# Patient Record
Sex: Male | Born: 1965 | Race: White | Hispanic: No | Marital: Married | Smoking: Never smoker
Health system: Southern US, Community
[De-identification: ages and names within clinical notes are randomized; demographics above are authoritative.]

## PROBLEM LIST (undated history)

## (undated) DIAGNOSIS — R011 Cardiac murmur, unspecified: Secondary | ICD-10-CM

## (undated) DIAGNOSIS — K219 Gastro-esophageal reflux disease without esophagitis: Secondary | ICD-10-CM

## (undated) DIAGNOSIS — E119 Type 2 diabetes mellitus without complications: Secondary | ICD-10-CM

## (undated) DIAGNOSIS — G4730 Sleep apnea, unspecified: Secondary | ICD-10-CM

## (undated) DIAGNOSIS — I10 Essential (primary) hypertension: Secondary | ICD-10-CM

## (undated) HISTORY — PX: TONSILLECTOMY: SUR1361

## (undated) SURGERY — INSERTION, CARDIAC PACEMAKER
Anesthesia: Choice

---

## 2012-04-10 HISTORY — PX: BACK SURGERY: SHX140

## 2014-07-23 ENCOUNTER — Ambulatory Visit
Admit: 2014-07-23 | Disposition: A | Discharge status: Home / Self Care | Payer: Self-pay | Attending: Family Medicine | Admitting: Family Medicine

## 2014-10-14 ENCOUNTER — Other Ambulatory Visit: Payer: Self-pay | Admitting: Neurosurgery

## 2014-10-14 DIAGNOSIS — M5440 Lumbago with sciatica, unspecified side: Secondary | ICD-10-CM

## 2014-12-23 ENCOUNTER — Other Ambulatory Visit: Payer: Self-pay | Admitting: Neurosurgery

## 2014-12-23 DIAGNOSIS — M545 Low back pain: Secondary | ICD-10-CM

## 2014-12-25 ENCOUNTER — Ambulatory Visit
Admission: RE | Admit: 2014-12-25 | Discharge: 2014-12-25 | Disposition: A | Discharge status: Home / Self Care | Payer: BLUE CROSS/BLUE SHIELD | Source: Ambulatory Visit | Attending: Neurosurgery | Admitting: Neurosurgery

## 2014-12-25 DIAGNOSIS — M545 Low back pain: Secondary | ICD-10-CM

## 2015-01-07 ENCOUNTER — Other Ambulatory Visit (HOSPITAL_COMMUNITY): Payer: Self-pay | Admitting: Neurosurgery

## 2015-02-19 ENCOUNTER — Encounter (HOSPITAL_COMMUNITY)
Admission: RE | Admit: 2015-02-19 | Discharge: 2015-02-19 | Disposition: A | Discharge status: Home / Self Care | Payer: BLUE CROSS/BLUE SHIELD | Source: Ambulatory Visit | Attending: Neurosurgery | Admitting: Neurosurgery

## 2015-02-26 ENCOUNTER — Encounter (HOSPITAL_COMMUNITY)
Admission: RE | Admit: 2015-02-26 | Discharge: 2015-02-26 | Disposition: A | Discharge status: Home / Self Care | Payer: BLUE CROSS/BLUE SHIELD | Source: Ambulatory Visit | Attending: Neurosurgery | Admitting: Neurosurgery

## 2015-02-26 ENCOUNTER — Encounter (HOSPITAL_COMMUNITY): Payer: Self-pay

## 2015-02-26 DIAGNOSIS — K219 Gastro-esophageal reflux disease without esophagitis: Secondary | ICD-10-CM | POA: Insufficient documentation

## 2015-02-26 DIAGNOSIS — Z7982 Long term (current) use of aspirin: Secondary | ICD-10-CM | POA: Insufficient documentation

## 2015-02-26 DIAGNOSIS — Z79899 Other long term (current) drug therapy: Secondary | ICD-10-CM | POA: Insufficient documentation

## 2015-02-26 DIAGNOSIS — I10 Essential (primary) hypertension: Secondary | ICD-10-CM | POA: Insufficient documentation

## 2015-02-26 DIAGNOSIS — I4510 Unspecified right bundle-branch block: Secondary | ICD-10-CM | POA: Diagnosis not present

## 2015-02-26 DIAGNOSIS — Z01812 Encounter for preprocedural laboratory examination: Secondary | ICD-10-CM | POA: Diagnosis not present

## 2015-02-26 DIAGNOSIS — E119 Type 2 diabetes mellitus without complications: Secondary | ICD-10-CM | POA: Insufficient documentation

## 2015-02-26 DIAGNOSIS — Z7984 Long term (current) use of oral hypoglycemic drugs: Secondary | ICD-10-CM | POA: Insufficient documentation

## 2015-02-26 DIAGNOSIS — G4733 Obstructive sleep apnea (adult) (pediatric): Secondary | ICD-10-CM | POA: Insufficient documentation

## 2015-02-26 DIAGNOSIS — Z01818 Encounter for other preprocedural examination: Secondary | ICD-10-CM | POA: Diagnosis not present

## 2015-02-26 HISTORY — DX: Gastro-esophageal reflux disease without esophagitis: K21.9

## 2015-02-26 HISTORY — DX: Sleep apnea, unspecified: G47.30

## 2015-02-26 HISTORY — DX: Essential (primary) hypertension: I10

## 2015-02-26 HISTORY — DX: Cardiac murmur, unspecified: R01.1

## 2015-02-26 HISTORY — DX: Type 2 diabetes mellitus without complications: E11.9

## 2015-02-26 LAB — BASIC METABOLIC PANEL
Anion gap: 11 (ref 5–15)
BUN: 10 mg/dL (ref 6–20)
CALCIUM: 9.4 mg/dL (ref 8.9–10.3)
CO2: 24 mmol/L (ref 22–32)
CREATININE: 0.92 mg/dL (ref 0.61–1.24)
Chloride: 100 mmol/L — ABNORMAL LOW (ref 101–111)
GFR calc Af Amer: >60 mL/min (ref >60)
GLUCOSE: 433 mg/dL — AB (ref 65–99)
Potassium: 3.9 mmol/L (ref 3.5–5.1)
SODIUM: 135 mmol/L (ref 135–145)

## 2015-02-26 LAB — CBC WITH DIFFERENTIAL/PLATELET
BASOS ABS: 0 10*3/uL (ref 0.0–0.1)
BASOS PCT: 0 %
EOS ABS: 0.2 10*3/uL (ref 0.0–0.7)
EOS PCT: 2 %
HCT: 39.9 % (ref 39.0–52.0)
Hemoglobin: 13.8 g/dL (ref 13.0–17.0)
Lymphocytes Relative: 23 %
Lymphs Abs: 1.9 10*3/uL (ref 0.7–4.0)
MCH: 29.8 pg (ref 26.0–34.0)
MCHC: 34.6 g/dL (ref 30.0–36.0)
MCV: 86.2 fL (ref 78.0–100.0)
MONO ABS: 0.5 10*3/uL (ref 0.1–1.0)
Monocytes Relative: 6 %
Neutro Abs: 5.9 10*3/uL (ref 1.7–7.7)
Neutrophils Relative %: 69 %
Platelets: 235 10*3/uL (ref 150–400)
RBC: 4.63 MIL/uL (ref 4.22–5.81)
RDW: 13.1 % (ref 11.5–15.5)
WBC: 8.5 10*3/uL (ref 4.0–10.5)

## 2015-02-26 LAB — GLUCOSE, CAPILLARY: Glucose-Capillary: 365 mg/dL — ABNORMAL HIGH (ref 65–99)

## 2015-02-26 LAB — SURGICAL PCR SCREEN
MRSA, PCR: NEGATIVE
STAPHYLOCOCCUS AUREUS: NEGATIVE

## 2015-02-26 NOTE — Pre-Procedure Instructions (Addendum)
Alyson ReedyBradford N Amiri  02/26/2015     No Pharmacies Listed   Your procedure is scheduled on 03/11/15.  [facility] at 600 A.M.  Call this number if you have problems the morning of surgery:  [phone]   Remember:  Do not eat food or drink liquids after midnight.  Take these medicines the morning of surgery with A SIP OF WATER prevacid, tylenol   STOP all herbel meds, nsaids (aleve,naproxen,advil,ibuprofen) 5 days prior to surgery starting 03/06/15  including vitamins(C), aspirin,mobic  How to Manage Your Diabetes Before Surgery   Why is it important to control my blood sugar before and after surgery?   Improving blood sugar levels before and after surgery helps healing and can limit problems.  A way of improving blood sugar control is eating a healthy diet by:  - Eating less sugar and carbohydrates  - Increasing activity/exercise  - Talk with your doctor about reaching your blood sugar goals  High blood sugars (greater than 180 mg/dL) can raise your risk of infections and slow down your recovery so you will need to focus on controlling your diabetes during the weeks before surgery.  Make sure that the doctor who takes care of your diabetes knows about your planned surgery including the date and location.  How do I manage my blood sugars before surgery?   Check your blood sugar at least 4 times a day, 2 days before surgery to make sure that they are not too high or low.   Check your blood sugar the morning of your surgery when you wake up and every 2               hours until you get to the Short-Stay unit.  If your blood sugar is less than 70 mg/dL, you will need to treat for low blood sugar by:  Treat a low blood sugar (less than 70 mg/dL) with 1/2 cup of clear juice (cranberry or apple), 4 glucose tablets, OR glucose gel.  Recheck blood sugar in 15 minutes after treatment (to make sure it is greater than 70 mg/dL).  If blood sugar is not  greater than 70 mg/dL on re-check, call [phone] for further instructions.   Report your blood sugar to the Short-Stay nurse when you get to Short-Stay.  References:  [facility], 2007 "How to Manage your Diabetes Before and After Surgery".  What do I do about my diabetes medications?   Do not take oral diabetes medicines (pills) the morning of surgery.metformin, januvia          Do not take other diabetes injectables the day of surgery including Byetta, Victoza, Bydureon, and Trulicity.     Do not wear jewelry, make-up or nail polish.  Do not wear lotions, powders, or perfumes.  You may wear deodorant.  Do not shave 48 hours prior to surgery.  Men may shave face and neck.  Do not bring valuables to the hospital.  [facility] is not responsible for any belongings or valuables.  Contacts, dentures or bridgework may not be worn into surgery.  Leave your suitcase in the car.  After surgery it may be brought to your room.  For patients admitted to the hospital, discharge time will be determined by your treatment team.  Patients discharged the day of surgery will not be allowed to drive home.   Name and phone number of your driver:    Special instructions:   Special  Instructions: [place] - Preparing for Surgery  Before surgery, you can play an important role.  Because skin is not sterile, your skin needs to be as free of germs as possible.  You can reduce the number of germs on you skin by washing with CHG (chlorahexidine gluconate) soap before surgery.  CHG is an antiseptic cleaner which kills germs and bonds with the skin to continue killing germs even after washing.  Please DO NOT use if you have an allergy to CHG or antibacterial soaps.  If your skin becomes reddened/irritated stop using the CHG and inform your nurse when you arrive at Short Stay.  Do not shave (including legs and underarms) for at least 48 hours prior to the first CHG  shower.  You may shave your face.  Please follow these instructions carefully:   1.  Shower with CHG Soap the night before surgery and the morning of Surgery.  2.  If you choose to wash your hair, wash your hair first as usual with your normal shampoo.  3.  After you shampoo, rinse your hair and body thoroughly to remove the Shampoo.  4.  Use CHG as you would any other liquid soap.  You can apply chg directly  to the skin and wash gently with scrungie or a clean washcloth.  5.  Apply the CHG Soap to your body ONLY FROM THE NECK DOWN.  Do not use on open wounds or open sores.  Avoid contact with your eyes ears, mouth and genitals (private parts).  Wash genitals (private parts)       with your normal soap.  6.  Wash thoroughly, paying special attention to the area where your surgery will be performed.  7.  Thoroughly rinse your body with warm water from the neck down.  8.  DO NOT shower/wash with your normal soap after using and rinsing off the CHG Soap.  9.  Pat yourself dry with a clean towel.            10.  Wear clean pajamas.            11.  Place clean sheets on your bed the night of your first shower and do not sleep with pets.  Day of Surgery  Do not apply any lotions/deodorants the morning of surgery.  Please wear clean clothes to the hospital/surgery center.  Please read over the following fact sheets that you were given. Pain Booklet, Coughing and Deep Breathing, MRSA Information and Surgical Site Infection Prevention

## 2015-02-26 NOTE — Progress Notes (Signed)
Spoke with angela kabbe np re: elevated cbg 376 at preadmit. Patient just ate lunch . Instructed to tell patient to be vigilant about watching what eats and taking meds between now and surgery. If cbg over 200 dos surgery could be canceled.patients understands.

## 2015-02-27 LAB — HEMOGLOBIN A1C
Hgb A1c MFr Bld: 10.8 % — ABNORMAL HIGH (ref 4.8–5.6)
Mean Plasma Glucose: 263 mg/dL

## 2015-03-01 NOTE — Progress Notes (Signed)
Anesthesia Chart Review:  Pt is 49 year old male scheduled for removal of percutaneous hardware T12-L3 on 03/11/2015 with Dr. Jordan LikesPool.   PMH includes:  HTN, DM, OSA (no CPAP after lost 50 pounds), heart murmur, GERD. Never smoker. BMI 31.   Medications include: ASA, lipitor, exenatide, prevacid, metformin, sitagliptin, valsartan-hctz.   Preoperative labs reviewed.  Glucose 433, hgbA1c 10.8. Pt is aware case is likely to be cancelled if his blood glucose is more than 200 DOS. Notified Darl PikesSusan in Dr. Lindalou HosePool's office of pt's uncontrolled DM.   EKG 02/26/15: NSR. Possible Left atrial enlargement. Left axis deviation. Right bundle branch block  If blood glucose acceptable DOS, I anticipate pt can proceed as scheduled.   Rica Mastngela Jammy Stlouis, FNP-BC [facility]/Anesthesiology Phone: [phone] 03/01/2015 3:13 PM

## 2015-03-10 MED ORDER — CEFAZOLIN SODIUM-DEXTROSE 2-3 GM-% IV SOLR
2.0000 g | INTRAVENOUS | Status: AC
  Administered 2015-03-11: 2 g via INTRAVENOUS
  Filled 2015-03-10: qty 50

## 2015-03-10 MED ORDER — DEXAMETHASONE SODIUM PHOSPHATE 10 MG/ML IJ SOLN
10.0000 mg | INTRAMUSCULAR | Status: DC
  Filled 2015-03-10: qty 1

## 2015-03-11 ENCOUNTER — Ambulatory Visit (HOSPITAL_COMMUNITY): Payer: BLUE CROSS/BLUE SHIELD | Admitting: Emergency Medicine

## 2015-03-11 ENCOUNTER — Ambulatory Visit (HOSPITAL_COMMUNITY): Payer: BLUE CROSS/BLUE SHIELD | Admitting: Critical Care Medicine

## 2015-03-11 ENCOUNTER — Encounter (HOSPITAL_COMMUNITY): Payer: Self-pay | Admitting: Critical Care Medicine

## 2015-03-11 ENCOUNTER — Encounter (HOSPITAL_COMMUNITY)
Admission: RE | Disposition: A | Discharge status: Home / Self Care | Payer: Self-pay | Source: Ambulatory Visit | Attending: Neurosurgery

## 2015-03-11 ENCOUNTER — Observation Stay (HOSPITAL_COMMUNITY)
Admission: RE | Admit: 2015-03-11 | Discharge: 2015-03-11 | Disposition: A | Discharge status: Home / Self Care | Payer: BLUE CROSS/BLUE SHIELD | Source: Ambulatory Visit | Attending: Neurosurgery | Admitting: Neurosurgery

## 2015-03-11 DIAGNOSIS — Z6830 Body mass index (BMI) 30.0-30.9, adult: Secondary | ICD-10-CM | POA: Insufficient documentation

## 2015-03-11 DIAGNOSIS — E669 Obesity, unspecified: Secondary | ICD-10-CM | POA: Diagnosis not present

## 2015-03-11 DIAGNOSIS — E119 Type 2 diabetes mellitus without complications: Secondary | ICD-10-CM | POA: Diagnosis not present

## 2015-03-11 DIAGNOSIS — K219 Gastro-esophageal reflux disease without esophagitis: Secondary | ICD-10-CM | POA: Insufficient documentation

## 2015-03-11 DIAGNOSIS — Z981 Arthrodesis status: Secondary | ICD-10-CM | POA: Insufficient documentation

## 2015-03-11 DIAGNOSIS — T8484XA Pain due to internal orthopedic prosthetic devices, implants and grafts, initial encounter: Secondary | ICD-10-CM | POA: Diagnosis not present

## 2015-03-11 DIAGNOSIS — Z79899 Other long term (current) drug therapy: Secondary | ICD-10-CM | POA: Insufficient documentation

## 2015-03-11 DIAGNOSIS — I10 Essential (primary) hypertension: Secondary | ICD-10-CM | POA: Diagnosis not present

## 2015-03-11 DIAGNOSIS — Y831 Surgical operation with implant of artificial internal device as the cause of abnormal reaction of the patient, or of later complication, without mention of misadventure at the time of the procedure: Secondary | ICD-10-CM | POA: Diagnosis not present

## 2015-03-11 DIAGNOSIS — G4730 Sleep apnea, unspecified: Secondary | ICD-10-CM | POA: Insufficient documentation

## 2015-03-11 DIAGNOSIS — Z791 Long term (current) use of non-steroidal anti-inflammatories (NSAID): Secondary | ICD-10-CM | POA: Diagnosis not present

## 2015-03-11 DIAGNOSIS — Z7984 Long term (current) use of oral hypoglycemic drugs: Secondary | ICD-10-CM | POA: Insufficient documentation

## 2015-03-11 DIAGNOSIS — Z7982 Long term (current) use of aspirin: Secondary | ICD-10-CM | POA: Insufficient documentation

## 2015-03-11 HISTORY — PX: HARDWARE REMOVAL: SHX979

## 2015-03-11 LAB — GLUCOSE, CAPILLARY
GLUCOSE-CAPILLARY: 198 mg/dL — AB (ref 65–99)
GLUCOSE-CAPILLARY: 180 mg/dL — AB (ref 65–99)
GLUCOSE-CAPILLARY: 204 mg/dL — AB (ref 65–99)
Glucose-Capillary: 165 mg/dL — ABNORMAL HIGH (ref 65–99)

## 2015-03-11 SURGERY — REMOVAL, HARDWARE
Anesthesia: General | Site: Back

## 2015-03-11 MED ORDER — LIDOCAINE HCL (CARDIAC) 20 MG/ML IV SOLN
INTRAVENOUS | Status: DC | PRN
  Administered 2015-03-11: 80 mg via INTRAVENOUS

## 2015-03-11 MED ORDER — SUCCINYLCHOLINE CHLORIDE 20 MG/ML IJ SOLN
INTRAMUSCULAR | Status: AC
  Filled 2015-03-11: qty 1

## 2015-03-11 MED ORDER — HYDROCODONE-ACETAMINOPHEN 5-325 MG PO TABS: 1.0000 | ORAL_TABLET | ORAL | Status: DC | PRN

## 2015-03-11 MED ORDER — ATORVASTATIN CALCIUM 20 MG PO TABS
20.0000 mg | ORAL_TABLET | Freq: Every day | ORAL | Status: DC
  Administered 2015-03-11: 20 mg via ORAL
  Filled 2015-03-11: qty 1

## 2015-03-11 MED ORDER — KETOROLAC TROMETHAMINE 30 MG/ML IJ SOLN
30.0000 mg | Freq: Four times a day (QID) | INTRAMUSCULAR | Status: DC
  Administered 2015-03-11: 30 mg via INTRAVENOUS
  Filled 2015-03-11: qty 1

## 2015-03-11 MED ORDER — HEMOSTATIC AGENTS (NO CHARGE) OPTIME
TOPICAL | Status: DC | PRN
  Administered 2015-03-11: 1 via TOPICAL

## 2015-03-11 MED ORDER — ROCURONIUM BROMIDE 50 MG/5ML IV SOLN
INTRAVENOUS | Status: AC
Start: 2015-03-11 — End: 2015-03-11
  Filled 2015-03-11: qty 1

## 2015-03-11 MED ORDER — PROPOFOL 10 MG/ML IV BOLUS
INTRAVENOUS | Status: DC | PRN
  Administered 2015-03-11: 180 mg via INTRAVENOUS
  Administered 2015-03-11: 20 mg via INTRAVENOUS

## 2015-03-11 MED ORDER — ASPIRIN EC 81 MG PO TBEC
81.0000 mg | DELAYED_RELEASE_TABLET | Freq: Every day | ORAL | Status: DC
  Administered 2015-03-11: 81 mg via ORAL
  Filled 2015-03-11: qty 1

## 2015-03-11 MED ORDER — ADULT MULTIVITAMIN W/MINERALS CH
1.0000 | ORAL_TABLET | Freq: Every day | ORAL | Status: DC
  Administered 2015-03-11: 1 via ORAL
  Filled 2015-03-11: qty 1

## 2015-03-11 MED ORDER — FENTANYL CITRATE (PF) 250 MCG/5ML IJ SOLN
INTRAMUSCULAR | Status: AC
Start: 2015-03-11 — End: 2015-03-11
  Filled 2015-03-11: qty 5

## 2015-03-11 MED ORDER — GLYCOPYRROLATE 0.2 MG/ML IJ SOLN
INTRAMUSCULAR | Status: AC
  Filled 2015-03-11: qty 3

## 2015-03-11 MED ORDER — NEOSTIGMINE METHYLSULFATE 10 MG/10ML IV SOLN
INTRAVENOUS | Status: AC
  Filled 2015-03-11: qty 1

## 2015-03-11 MED ORDER — SODIUM CHLORIDE 0.9 % IR SOLN
Status: DC | PRN
  Administered 2015-03-11: 500 mL

## 2015-03-11 MED ORDER — BUPIVACAINE HCL (PF) 0.25 % IJ SOLN
INTRAMUSCULAR | Status: DC | PRN
  Administered 2015-03-11: 20 mL

## 2015-03-11 MED ORDER — CYCLOBENZAPRINE HCL 10 MG PO TABS: 10.0000 mg | ORAL_TABLET | Freq: Three times a day (TID) | ORAL | Status: DC | PRN

## 2015-03-11 MED ORDER — SODIUM CHLORIDE 0.9 % IJ SOLN: 3.0000 mL | Freq: Two times a day (BID) | INTRAMUSCULAR | Status: DC

## 2015-03-11 MED ORDER — SODIUM CHLORIDE 0.9 % IJ SOLN: 3.0000 mL | INTRAMUSCULAR | Status: DC | PRN

## 2015-03-11 MED ORDER — LIDOCAINE HCL (CARDIAC) 20 MG/ML IV SOLN
INTRAVENOUS | Status: AC
  Filled 2015-03-11: qty 5

## 2015-03-11 MED ORDER — PHENYLEPHRINE 40 MCG/ML (10ML) SYRINGE FOR IV PUSH (FOR BLOOD PRESSURE SUPPORT)
PREFILLED_SYRINGE | INTRAVENOUS | Status: AC
  Filled 2015-03-11: qty 10

## 2015-03-11 MED ORDER — HYDROMORPHONE HCL 1 MG/ML IJ SOLN
0.5000 mg | INTRAMUSCULAR | Status: DC | PRN
  Administered 2015-03-11: 1 mg via INTRAVENOUS
  Filled 2015-03-11: qty 1

## 2015-03-11 MED ORDER — NEOSTIGMINE METHYLSULFATE 10 MG/10ML IV SOLN
INTRAVENOUS | Status: DC | PRN
  Administered 2015-03-11: 4 mg via INTRAVENOUS

## 2015-03-11 MED ORDER — THROMBIN 5000 UNITS EX SOLR
CUTANEOUS | Status: DC | PRN
  Administered 2015-03-11 (×2): 5000 [IU] via TOPICAL

## 2015-03-11 MED ORDER — VALSARTAN-HYDROCHLOROTHIAZIDE 320-25 MG PO TABS: 1.0000 | ORAL_TABLET | Freq: Every day | ORAL | Status: DC

## 2015-03-11 MED ORDER — IRBESARTAN 300 MG PO TABS
300.0000 mg | ORAL_TABLET | Freq: Every day | ORAL | Status: DC
  Administered 2015-03-11: 300 mg via ORAL
  Filled 2015-03-11: qty 1

## 2015-03-11 MED ORDER — PROPOFOL 10 MG/ML IV BOLUS
INTRAVENOUS | Status: AC
Start: 2015-03-11 — End: 2015-03-11
  Filled 2015-03-11: qty 20

## 2015-03-11 MED ORDER — LACTATED RINGERS IV SOLN
INTRAVENOUS | Status: DC | PRN
  Administered 2015-03-11 (×2): via INTRAVENOUS

## 2015-03-11 MED ORDER — HYDROCHLOROTHIAZIDE 25 MG PO TABS
25.0000 mg | ORAL_TABLET | Freq: Every day | ORAL | Status: DC
  Administered 2015-03-11: 25 mg via ORAL
  Filled 2015-03-11: qty 1

## 2015-03-11 MED ORDER — PANTOPRAZOLE SODIUM 20 MG PO TBEC: 20.0000 mg | DELAYED_RELEASE_TABLET | Freq: Every day | ORAL | Status: DC

## 2015-03-11 MED ORDER — EPHEDRINE SULFATE 50 MG/ML IJ SOLN
INTRAMUSCULAR | Status: AC
  Filled 2015-03-11: qty 1

## 2015-03-11 MED ORDER — 0.9 % SODIUM CHLORIDE (POUR BTL) OPTIME
TOPICAL | Status: DC | PRN
  Administered 2015-03-11: 1000 mL

## 2015-03-11 MED ORDER — ONDANSETRON HCL 4 MG/2ML IJ SOLN
INTRAMUSCULAR | Status: DC | PRN
  Administered 2015-03-11: 4 mg via INTRAVENOUS

## 2015-03-11 MED ORDER — MIDAZOLAM HCL 2 MG/2ML IJ SOLN
INTRAMUSCULAR | Status: AC
Start: 2015-03-11 — End: 2015-03-11
  Filled 2015-03-11: qty 2

## 2015-03-11 MED ORDER — CEFAZOLIN SODIUM 1-5 GM-% IV SOLN
1.0000 g | Freq: Three times a day (TID) | INTRAVENOUS | Status: DC
  Administered 2015-03-11: 1 g via INTRAVENOUS
  Filled 2015-03-11: qty 50

## 2015-03-11 MED ORDER — METFORMIN HCL 500 MG PO TABS: 1000.0000 mg | ORAL_TABLET | Freq: Two times a day (BID) | ORAL | Status: DC

## 2015-03-11 MED ORDER — KETOROLAC TROMETHAMINE 30 MG/ML IJ SOLN
INTRAMUSCULAR | Status: AC
  Filled 2015-03-11: qty 1

## 2015-03-11 MED ORDER — MENTHOL 3 MG MT LOZG: 1.0000 | LOZENGE | OROMUCOSAL | Status: DC | PRN

## 2015-03-11 MED ORDER — PHENOL 1.4 % MT LIQD: 1.0000 | OROMUCOSAL | Status: DC | PRN

## 2015-03-11 MED ORDER — KETOROLAC TROMETHAMINE 30 MG/ML IJ SOLN
INTRAMUSCULAR | Status: DC | PRN
  Administered 2015-03-11: 30 mg via INTRAVENOUS

## 2015-03-11 MED ORDER — EXENATIDE ER 2 MG ~~LOC~~ PEN: 2.0000 mg | PEN_INJECTOR | SUBCUTANEOUS | Status: DC

## 2015-03-11 MED ORDER — ROCURONIUM BROMIDE 100 MG/10ML IV SOLN
INTRAVENOUS | Status: DC | PRN
  Administered 2015-03-11: 40 mg via INTRAVENOUS
  Administered 2015-03-11: 10 mg via INTRAVENOUS

## 2015-03-11 MED ORDER — ACETAMINOPHEN 325 MG PO TABS: 650.0000 mg | ORAL_TABLET | ORAL | Status: DC | PRN

## 2015-03-11 MED ORDER — ONDANSETRON HCL 4 MG/2ML IJ SOLN
4.0000 mg | INTRAMUSCULAR | Status: DC | PRN
  Administered 2015-03-11: 4 mg via INTRAVENOUS
  Filled 2015-03-11: qty 2

## 2015-03-11 MED ORDER — MELOXICAM 15 MG PO TABS
15.0000 mg | ORAL_TABLET | Freq: Every day | ORAL | Status: DC
  Administered 2015-03-11: 15 mg via ORAL
  Filled 2015-03-11: qty 1

## 2015-03-11 MED ORDER — FENTANYL CITRATE (PF) 250 MCG/5ML IJ SOLN
INTRAMUSCULAR | Status: DC | PRN
  Administered 2015-03-11 (×3): 50 ug via INTRAVENOUS
  Administered 2015-03-11: 25 ug via INTRAVENOUS
  Administered 2015-03-11: 50 ug via INTRAVENOUS
  Administered 2015-03-11: 25 ug via INTRAVENOUS

## 2015-03-11 MED ORDER — OXYCODONE-ACETAMINOPHEN 5-325 MG PO TABS
1.0000 | ORAL_TABLET | ORAL | Status: DC | PRN
  Administered 2015-03-11: 2 via ORAL
  Filled 2015-03-11: qty 2

## 2015-03-11 MED ORDER — GLYCOPYRROLATE 0.2 MG/ML IJ SOLN
INTRAMUSCULAR | Status: DC | PRN
  Administered 2015-03-11: 0.6 mg via INTRAVENOUS

## 2015-03-11 MED ORDER — LINAGLIPTIN 5 MG PO TABS: 5.0000 mg | ORAL_TABLET | Freq: Every day | ORAL | Status: DC

## 2015-03-11 MED ORDER — ACETAMINOPHEN 650 MG RE SUPP: 650.0000 mg | RECTAL | Status: DC | PRN

## 2015-03-11 MED ORDER — PHENYLEPHRINE HCL 10 MG/ML IJ SOLN
INTRAMUSCULAR | Status: DC | PRN
  Administered 2015-03-11 (×4): 80 ug via INTRAVENOUS
  Administered 2015-03-11: 40 ug via INTRAVENOUS

## 2015-03-11 MED ORDER — MIDAZOLAM HCL 5 MG/5ML IJ SOLN
INTRAMUSCULAR | Status: DC | PRN
  Administered 2015-03-11: 2 mg via INTRAVENOUS

## 2015-03-11 MED ORDER — VITAMIN C 500 MG PO TABS
500.0000 mg | ORAL_TABLET | Freq: Every day | ORAL | Status: DC
  Administered 2015-03-11: 500 mg via ORAL
  Filled 2015-03-11: qty 1

## 2015-03-11 MED ORDER — SODIUM CHLORIDE 0.9 % IV SOLN: 250.0000 mL | INTRAVENOUS | Status: DC

## 2015-03-11 SURGICAL SUPPLY — 44 items
BAG DECANTER FOR FLEXI CONT (MISCELLANEOUS) ×3 IMPLANT
BENZOIN TINCTURE PRP APPL 2/3 (GAUZE/BANDAGES/DRESSINGS) ×3 IMPLANT
BLADE CLIPPER SURG (BLADE) IMPLANT
BRUSH SCRUB EZ PLAIN DRY (MISCELLANEOUS) ×3 IMPLANT
BUR CUTTER 7.0 ROUND (BURR) ×3 IMPLANT
CANISTER SUCT 3000ML PPV (MISCELLANEOUS) ×3 IMPLANT
CLOSURE WOUND 1/2 X4 (GAUZE/BANDAGES/DRESSINGS) ×1
DECANTER SPIKE VIAL GLASS SM (MISCELLANEOUS) ×3 IMPLANT
DRAPE LAPAROTOMY 100X72X124 (DRAPES) ×3 IMPLANT
DRAPE MICROSCOPE LEICA (MISCELLANEOUS) ×3 IMPLANT
DRAPE POUCH INSTRU U-SHP 10X18 (DRAPES) ×3 IMPLANT
DRAPE PROXIMA HALF (DRAPES) IMPLANT
DRAPE SURG 17X23 STRL (DRAPES) ×6 IMPLANT
ELECT REM PT RETURN 9FT ADLT (ELECTROSURGICAL) ×3
ELECTRODE REM PT RTRN 9FT ADLT (ELECTROSURGICAL) ×1 IMPLANT
GAUZE SPONGE 4X4 12PLY STRL (GAUZE/BANDAGES/DRESSINGS) ×3 IMPLANT
GAUZE SPONGE 4X4 16PLY XRAY LF (GAUZE/BANDAGES/DRESSINGS) IMPLANT
GLOVE ECLIPSE 9.0 STRL (GLOVE) ×3 IMPLANT
GLOVE EXAM NITRILE LRG STRL (GLOVE) IMPLANT
GLOVE EXAM NITRILE MD LF STRL (GLOVE) IMPLANT
GLOVE EXAM NITRILE XL STR (GLOVE) IMPLANT
GLOVE EXAM NITRILE XS STR PU (GLOVE) IMPLANT
GOWN STRL REUS W/ TWL LRG LVL3 (GOWN DISPOSABLE) IMPLANT
GOWN STRL REUS W/ TWL XL LVL3 (GOWN DISPOSABLE) ×1 IMPLANT
GOWN STRL REUS W/TWL 2XL LVL3 (GOWN DISPOSABLE) IMPLANT
GOWN STRL REUS W/TWL LRG LVL3 (GOWN DISPOSABLE)
GOWN STRL REUS W/TWL XL LVL3 (GOWN DISPOSABLE) ×2
KIT BASIN OR (CUSTOM PROCEDURE TRAY) ×3 IMPLANT
KIT ROOM TURNOVER OR (KITS) ×3 IMPLANT
LIQUID BAND (GAUZE/BANDAGES/DRESSINGS) ×3 IMPLANT
NEEDLE HYPO 22GX1.5 SAFETY (NEEDLE) ×3 IMPLANT
NEEDLE SPNL 22GX3.5 QUINCKE BK (NEEDLE) ×3 IMPLANT
NS IRRIG 1000ML POUR BTL (IV SOLUTION) ×3 IMPLANT
PACK LAMINECTOMY NEURO (CUSTOM PROCEDURE TRAY) ×3 IMPLANT
PAD ARMBOARD 7.5X6 YLW CONV (MISCELLANEOUS) ×9 IMPLANT
RUBBERBAND STERILE (MISCELLANEOUS) ×6 IMPLANT
SPONGE SURGIFOAM ABS GEL 100 (HEMOSTASIS) ×3 IMPLANT
STRIP CLOSURE SKIN 1/2X4 (GAUZE/BANDAGES/DRESSINGS) ×2 IMPLANT
SUT VIC AB 2-0 CT1 18 (SUTURE) ×12 IMPLANT
SUT VIC AB 3-0 SH 8-18 (SUTURE) ×12 IMPLANT
TAPE STRIPS DRAPE STRL (GAUZE/BANDAGES/DRESSINGS) ×3 IMPLANT
TOWEL OR 17X24 6PK STRL BLUE (TOWEL DISPOSABLE) ×3 IMPLANT
TOWEL OR 17X26 10 PK STRL BLUE (TOWEL DISPOSABLE) ×3 IMPLANT
WATER STERILE IRR 1000ML POUR (IV SOLUTION) ×3 IMPLANT

## 2015-03-11 NOTE — Anesthesia Postprocedure Evaluation (Signed)
Anesthesia Post Note  Patient: Alyson ReedyBradford N Dignan  Procedure(s) Performed: Procedure(s) (LRB): Removal of percutaneous hardware  - T12 - L3 (N/A)  Patient location during evaluation: PACU Anesthesia Type: General Level of consciousness: awake and alert Pain management: pain level controlled Vital Signs Assessment: post-procedure vital signs reviewed and stable Respiratory status: spontaneous breathing, nonlabored ventilation, respiratory function stable and patient connected to nasal cannula oxygen Cardiovascular status: blood pressure returned to baseline and stable Postop Assessment: no signs of nausea or vomiting Anesthetic complications: no    Last Vitals:  Filed Vitals:   03/11/15 0656 03/11/15 0932  BP: 112/78 121/70  Pulse: 100 99  Temp: 37.2 C 37.1 C  Resp: 20     Last Pain:  Filed Vitals:   03/11/15 0940  PainSc: 0-No pain                 Elga Santy,JAMES TERRILL

## 2015-03-11 NOTE — Discharge Summary (Signed)
Physician Discharge Summary  Patient ID: Alyson ReedyBradford N Amara MRN: [id number] DOB/AGE: 232-Jan-1967 49 y.o.  Admit date: 03/11/2015 Discharge date: 03/11/2015  Admission Diagnoses:  Discharge Diagnoses:  Active Problems:   Painful orthopaedic hardware   Discharged Condition: good  Hospital Course: The patient was admitted to the hospital where he underwent an uncomplicated reexploration of his thoracic lumbar fusion with removal of painful hardware. Patient is doing well postop. Pain very well controlled. No new problems. Up ambulating without difficulty. Ready for discharge home.  Consults:   Significant Diagnostic Studies:   Treatments:   Discharge Exam: Blood pressure 119/76, pulse 95, temperature 98.3 F (36.8 C), temperature source Oral, resp. rate 20, height 6\' 1"  (1.854 m), weight 106.369 kg (234 lb 8 oz), SpO2 97 %. Awake and alert. Oriented and appropriate. Cranial nerve function is intact. Motor and sensory function extremities normal. Wounds clean and dry. Chest and abdomen benign.  Disposition: Final discharge disposition not confirmed     Medication List    TAKE these medications        acetaminophen 650 MG CR tablet  Commonly known as:  TYLENOL  Take 650 mg by mouth daily as needed for pain.     aspirin EC 81 MG tablet  Take 81 mg by mouth daily.     atorvastatin 20 MG tablet  Commonly known as:  LIPITOR  Take 20 mg by mouth daily.     BYDUREON 2 MG Pen  Generic drug:  Exenatide ER  Inject 2 mg into the skin once a week. Mondays     HYDROcodone-acetaminophen 5-325 MG tablet  Commonly known as:  NORCO/VICODIN  Take 1-2 tablets by mouth every 4 (four) hours as needed (mild pain).     ICY HOT EX  Apply 1 application topically daily as needed (back pain).     lansoprazole 30 MG capsule  Commonly known as:  PREVACID  Take 30 mg by mouth daily at 12 noon.     meloxicam 15 MG tablet  Commonly known as:  MOBIC  Take 15 mg by mouth daily.     metFORMIN 1000 MG tablet  Commonly known as:  GLUCOPHAGE  Take 1,000 mg by mouth 2 (two) times daily with a meal.     multivitamin with minerals Tabs tablet  Take 1 tablet by mouth daily.     sitaGLIPtin 100 MG tablet  Commonly known as:  JANUVIA  Take 100 mg by mouth daily.     valsartan-hydrochlorothiazide 320-25 MG tablet  Commonly known as:  DIOVAN-HCT  Take 1 tablet by mouth daily.     vitamin C 500 MG tablet  Commonly known as:  ASCORBIC ACID  Take 500 mg by mouth daily.           Follow-up Information    Follow up with Temple PaciniPOOL,Anthoni Geerts A, MD.   Specialty:  Neurosurgery   Contact information:   [address] [place] [address] [phone]       Signed: Temple PaciniOOL,Sinclair Arrazola A 03/11/2015, 5:28 PM
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
## 2015-03-11 NOTE — Anesthesia Postprocedure Evaluation (Signed)
Anesthesia Post Note  Patient: Douglas Greene  Procedure(s) Performed: Procedure(s) (LRB): Removal of percutaneous hardware  - T12 - L3 (N/A)  Patient location during evaluation: PACU Anesthesia Type: General Level of consciousness: awake and alert and oriented Pain management: pain level controlled Vital Signs Assessment: post-procedure vital signs reviewed and stable Respiratory status: spontaneous breathing and respiratory function stable Cardiovascular status: stable Anesthetic complications: no    Last Vitals:  Filed Vitals:   03/11/15 0656 03/11/15 0932  BP: 112/78 121/70  Pulse: 100 99  Temp: 37.2 C 37.1 C  Resp: 20     Last Pain:  Filed Vitals:   03/11/15 0940  PainSc: 0-No pain                 Charrie Mcconnon,JAMES TERRILL

## 2015-03-11 NOTE — Progress Notes (Signed)
Patient alert and oriented, mae's well, voiding adequate amount of urine, swallowing without difficulty, no c/o pain. Patient discharged home with family. Script and discharged instructions given to patient. Patient and family stated understanding of d/c instructions given and has an appointment with MD. 

## 2015-03-11 NOTE — Transfer of Care (Signed)
Immediate Anesthesia Transfer of Care Note  Patient: Douglas ReedyBradford N Heyward  Procedure(s) Performed: Procedure(s) with comments: Removal of percutaneous hardware  - T12 - L3 (N/A) - Removal of percutaneous hardware  - T12 - L3  Patient Location: PACU  Anesthesia Type:General  Level of Consciousness: awake, alert  and oriented  Airway & Oxygen Therapy: Patient Spontanous Breathing and Patient connected to nasal cannula oxygen  Post-op Assessment: Report given to RN, Post -op Vital signs reviewed and stable and Patient moving all extremities X 4  Post vital signs: Reviewed and stable  Last Vitals:  Filed Vitals:   03/11/15 0656 03/11/15 0932  BP: 112/78 121/70  Pulse: 100 99  Temp: 37.2 C 37.1 C  Resp: 20     Complications: No apparent anesthesia complications

## 2015-03-11 NOTE — Anesthesia Procedure Notes (Signed)
Procedure Name: Intubation Date/Time: 03/11/2015 7:57 AM Performed by: Glo HerringLEE, Shante Maysonet B Pre-anesthesia Checklist: Patient identified, Timeout performed, Emergency Drugs available, Suction available and Patient being monitored Patient Re-evaluated:Patient Re-evaluated prior to inductionOxygen Delivery Method: Circle system utilized Preoxygenation: Pre-oxygenation with 100% oxygen Intubation Type: IV induction Ventilation: Mask ventilation without difficulty Laryngoscope Size: Mac and 4 Grade View: Grade II Tube type: Oral Tube size: 7.5 mm Number of attempts: 1 Airway Equipment and Method: Stylet Placement Confirmation: CO2 detector,  ETT inserted through vocal cords under direct vision,  breath sounds checked- equal and bilateral and positive ETCO2 Secured at: 24 cm Tube secured with: Tape Dental Injury: Teeth and Oropharynx as per pre-operative assessment

## 2015-03-11 NOTE — Brief Op Note (Signed)
03/11/2015  9:18 AM  PATIENT:  Douglas Greene  49 y.o. male  PRE-OPERATIVE DIAGNOSIS:  Pseudoarthrosis  POST-OPERATIVE DIAGNOSIS:  Pseudoarthrosis  PROCEDURE:  Procedure(s) with comments: Removal of percutaneous hardware  - T12 - L3 (N/A) - Removal of percutaneous hardware  - T12 - L3  SURGEON:  Surgeon(s) and Role:    * Julio SicksHenry Shiree Altemus, MD - Primary    * Maeola HarmanJoseph Stern, MD - Assisting  PHYSICIAN ASSISTANT:   ASSISTANTS:    ANESTHESIA:   general  EBL:  Total I/O In: 1200 [I.V.:1200] Out: -   BLOOD ADMINISTERED:none  DRAINS: none   LOCAL MEDICATIONS USED:  MARCAINE     SPECIMEN:  No Specimen  DISPOSITION OF SPECIMEN:  N/A  COUNTS:  YES  TOURNIQUET:  * No tourniquets in log *  DICTATION: .Dragon Dictation  PLAN OF CARE: Admit for overnight observation  PATIENT DISPOSITION:  PACU - hemodynamically stable.   Delay start of Pharmacological VTE agent (>24hrs) due to surgical blood loss or risk of bleeding: yes

## 2015-03-11 NOTE — Discharge Instructions (Signed)

## 2015-03-11 NOTE — Anesthesia Preprocedure Evaluation (Addendum)
Anesthesia Evaluation  Patient identified by MRN, date of birth, ID band Patient awake    Reviewed: Allergy & Precautions, NPO status , Patient's Chart, lab work & pertinent test results  History of Anesthesia Complications Negative for: history of anesthetic complications  Airway Mallampati: II  TM Distance: >3 FB Neck ROM: Full    Dental  (+) Dental Advisory Given   Pulmonary sleep apnea ,    breath sounds clear to auscultation       Cardiovascular hypertension, Pt. on medications  Rhythm:Regular Rate:Normal  RBBB on EkG   Neuro/Psych    GI/Hepatic GERD  Medicated,  Endo/Other  diabetes, Type 2, Oral Hypoglycemic Agents  Renal/GU      Musculoskeletal   Abdominal (+) + obese,   Peds  Hematology   Anesthesia Other Findings   Reproductive/Obstetrics                           Anesthesia Physical Anesthesia Plan  ASA: III  Anesthesia Plan: General   Post-op Pain Management:    Induction: Intravenous  Airway Management Planned: Oral ETT  Additional Equipment:   Intra-op Plan:   Post-operative Plan: Extubation in OR  Informed Consent: I have reviewed the patients History and Physical, chart, labs and discussed the procedure including the risks, benefits and alternatives for the proposed anesthesia with the patient or authorized representative who has indicated his/her understanding and acceptance.   Dental advisory given  Plan Discussed with: Anesthesiologist and Surgeon  Anesthesia Plan Comments:        Anesthesia Quick Evaluation

## 2015-03-12 ENCOUNTER — Encounter (HOSPITAL_COMMUNITY): Payer: Self-pay | Admitting: Neurosurgery

## 2015-07-15 ENCOUNTER — Other Ambulatory Visit: Payer: Self-pay | Admitting: Neurosurgery

## 2015-07-15 DIAGNOSIS — S32009K Unspecified fracture of unspecified lumbar vertebra, subsequent encounter for fracture with nonunion: Secondary | ICD-10-CM

## 2015-07-20 ENCOUNTER — Ambulatory Visit
Admission: RE | Admit: 2015-07-20 | Discharge: 2015-07-20 | Disposition: A | Discharge status: Home / Self Care | Payer: BLUE CROSS/BLUE SHIELD | Source: Ambulatory Visit | Attending: Neurosurgery | Admitting: Neurosurgery

## 2015-07-20 DIAGNOSIS — S32009K Unspecified fracture of unspecified lumbar vertebra, subsequent encounter for fracture with nonunion: Secondary | ICD-10-CM

## 2015-07-20 DIAGNOSIS — M5134 Other intervertebral disc degeneration, thoracic region: Secondary | ICD-10-CM | POA: Diagnosis not present

## 2015-07-20 DIAGNOSIS — M5124 Other intervertebral disc displacement, thoracic region: Secondary | ICD-10-CM | POA: Diagnosis not present

## 2015-07-20 DIAGNOSIS — M5126 Other intervertebral disc displacement, lumbar region: Secondary | ICD-10-CM | POA: Diagnosis not present

## 2015-07-20 DIAGNOSIS — S32019A Unspecified fracture of first lumbar vertebra, initial encounter for closed fracture: Secondary | ICD-10-CM | POA: Insufficient documentation

## 2015-08-10 ENCOUNTER — Ambulatory Visit: Payer: BLUE CROSS/BLUE SHIELD

## 2015-08-23 ENCOUNTER — Other Ambulatory Visit: Payer: Self-pay | Admitting: Neurosurgery

## 2015-08-23 DIAGNOSIS — M5126 Other intervertebral disc displacement, lumbar region: Secondary | ICD-10-CM

## 2015-08-26 ENCOUNTER — Ambulatory Visit
Admission: RE | Admit: 2015-08-26 | Discharge: 2015-08-26 | Disposition: A | Discharge status: Home / Self Care | Payer: BLUE CROSS/BLUE SHIELD | Source: Ambulatory Visit | Attending: Neurosurgery | Admitting: Neurosurgery

## 2015-08-26 DIAGNOSIS — M5126 Other intervertebral disc displacement, lumbar region: Secondary | ICD-10-CM

## 2015-08-26 MED ORDER — IOPAMIDOL (ISOVUE-M 200) INJECTION 41%
1.0000 mL | Freq: Once | INTRAMUSCULAR | Status: AC
  Administered 2015-08-26: 1 mL via EPIDURAL

## 2015-08-26 MED ORDER — METHYLPREDNISOLONE ACETATE 40 MG/ML INJ SUSP (RADIOLOG
120.0000 mg | Freq: Once | INTRAMUSCULAR | Status: AC
  Administered 2015-08-26: 120 mg via EPIDURAL

## 2015-08-26 NOTE — Discharge Instructions (Signed)
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
## 2019-06-06 ENCOUNTER — Other Ambulatory Visit: Payer: Self-pay | Admitting: Cardiology

## 2019-06-06 ENCOUNTER — Other Ambulatory Visit: Payer: Self-pay

## 2019-06-06 ENCOUNTER — Other Ambulatory Visit
Admission: RE | Admit: 2019-06-06 | Discharge: 2019-06-06 | Disposition: A | Discharge status: Home / Self Care | Payer: BC Managed Care – PPO | Source: Ambulatory Visit | Attending: Cardiology | Admitting: Cardiology

## 2019-06-06 DIAGNOSIS — Z20822 Contact with and (suspected) exposure to covid-19: Secondary | ICD-10-CM | POA: Diagnosis not present

## 2019-06-06 DIAGNOSIS — Z01812 Encounter for preprocedural laboratory examination: Secondary | ICD-10-CM | POA: Insufficient documentation

## 2019-06-06 DIAGNOSIS — Z01818 Encounter for other preprocedural examination: Secondary | ICD-10-CM

## 2019-06-06 LAB — SARS CORONAVIRUS 2 (TAT 6-24 HRS): SARS Coronavirus 2: NEGATIVE

## 2019-06-06 MED ORDER — SODIUM CHLORIDE 0.9% FLUSH: 3.0000 mL | Freq: Two times a day (BID) | INTRAVENOUS | Status: AC

## 2019-06-10 ENCOUNTER — Other Ambulatory Visit: Payer: Self-pay

## 2019-06-10 ENCOUNTER — Ambulatory Visit: Payer: BC Managed Care – PPO

## 2019-06-10 ENCOUNTER — Observation Stay
Admission: RE | Admit: 2019-06-10 | Discharge: 2019-06-11 | Disposition: A | Discharge status: Home / Self Care | Payer: BC Managed Care – PPO | Attending: Cardiology | Admitting: Cardiology

## 2019-06-10 ENCOUNTER — Ambulatory Visit: Payer: BC Managed Care – PPO | Admitting: Anesthesiology

## 2019-06-10 ENCOUNTER — Observation Stay: Payer: BC Managed Care – PPO

## 2019-06-10 ENCOUNTER — Other Ambulatory Visit: Payer: Self-pay | Admitting: Cardiology

## 2019-06-10 ENCOUNTER — Encounter: Payer: Self-pay | Admitting: Cardiology

## 2019-06-10 ENCOUNTER — Encounter
Admission: RE | Disposition: A | Discharge status: Home / Self Care | Payer: Self-pay | Source: Home / Self Care | Attending: Cardiology

## 2019-06-10 DIAGNOSIS — E781 Pure hyperglyceridemia: Secondary | ICD-10-CM | POA: Insufficient documentation

## 2019-06-10 DIAGNOSIS — E119 Type 2 diabetes mellitus without complications: Secondary | ICD-10-CM | POA: Diagnosis not present

## 2019-06-10 DIAGNOSIS — Z79899 Other long term (current) drug therapy: Secondary | ICD-10-CM | POA: Diagnosis not present

## 2019-06-10 DIAGNOSIS — I10 Essential (primary) hypertension: Secondary | ICD-10-CM | POA: Insufficient documentation

## 2019-06-10 DIAGNOSIS — Z950 Presence of cardiac pacemaker: Secondary | ICD-10-CM

## 2019-06-10 DIAGNOSIS — Z01818 Encounter for other preprocedural examination: Secondary | ICD-10-CM

## 2019-06-10 DIAGNOSIS — Z794 Long term (current) use of insulin: Secondary | ICD-10-CM | POA: Diagnosis not present

## 2019-06-10 DIAGNOSIS — G4730 Sleep apnea, unspecified: Secondary | ICD-10-CM | POA: Diagnosis not present

## 2019-06-10 DIAGNOSIS — E785 Hyperlipidemia, unspecified: Secondary | ICD-10-CM | POA: Insufficient documentation

## 2019-06-10 DIAGNOSIS — Z7982 Long term (current) use of aspirin: Secondary | ICD-10-CM | POA: Diagnosis not present

## 2019-06-10 DIAGNOSIS — K219 Gastro-esophageal reflux disease without esophagitis: Secondary | ICD-10-CM | POA: Insufficient documentation

## 2019-06-10 DIAGNOSIS — I442 Atrioventricular block, complete: Principal | ICD-10-CM | POA: Diagnosis present

## 2019-06-10 DIAGNOSIS — I495 Sick sinus syndrome: Secondary | ICD-10-CM | POA: Insufficient documentation

## 2019-06-10 DIAGNOSIS — R9439 Abnormal result of other cardiovascular function study: Secondary | ICD-10-CM

## 2019-06-10 HISTORY — PX: PACEMAKER INSERTION: SHX728

## 2019-06-10 LAB — GLUCOSE, CAPILLARY
Glucose-Capillary: 226 mg/dL — ABNORMAL HIGH (ref 70–99)
Glucose-Capillary: 177 mg/dL — ABNORMAL HIGH (ref 70–99)
Glucose-Capillary: 155 mg/dL — ABNORMAL HIGH (ref 70–99)
Glucose-Capillary: 229 mg/dL — ABNORMAL HIGH (ref 70–99)
Glucose-Capillary: 302 mg/dL — ABNORMAL HIGH (ref 70–99)

## 2019-06-10 LAB — SURGICAL PCR SCREEN
MRSA, PCR: NEGATIVE
Staphylococcus aureus: POSITIVE — AB

## 2019-06-10 SURGERY — LEFT HEART CATH AND CORONARY ANGIOGRAPHY
Anesthesia: Moderate Sedation | Laterality: Left

## 2019-06-10 SURGERY — INSERTION, CARDIAC PACEMAKER
Anesthesia: Monitor Anesthesia Care | Site: Chest | Laterality: Left

## 2019-06-10 MED ORDER — FAMOTIDINE 20 MG PO TABS: 20.0000 mg | ORAL_TABLET | Freq: Once | ORAL | Status: AC

## 2019-06-10 MED ORDER — SODIUM CHLORIDE 0.9 % IV SOLN
80.0000 mg | INTRAVENOUS | Status: DC
  Filled 2019-06-10: qty 2

## 2019-06-10 MED ORDER — SODIUM CHLORIDE 0.9 % IV SOLN: INTRAVENOUS | Status: DC

## 2019-06-10 MED ORDER — FAMOTIDINE 20 MG PO TABS
ORAL_TABLET | ORAL | Status: AC
  Administered 2019-06-10: 20 mg via ORAL
  Filled 2019-06-10: qty 1

## 2019-06-10 MED ORDER — GENTAMICIN SULFATE 40 MG/ML IJ SOLN
INTRAMUSCULAR | Status: AC
  Filled 2019-06-10: qty 2

## 2019-06-10 MED ORDER — PROPOFOL 10 MG/ML IV BOLUS
INTRAVENOUS | Status: AC
  Filled 2019-06-10: qty 20

## 2019-06-10 MED ORDER — PROPOFOL 500 MG/50ML IV EMUL
INTRAVENOUS | Status: AC
  Filled 2019-06-10: qty 50

## 2019-06-10 MED ORDER — LIDOCAINE 1 % OPTIME INJ - NO CHARGE
INTRAMUSCULAR | Status: DC | PRN
  Administered 2019-06-10: 27 mL

## 2019-06-10 MED ORDER — FENTANYL CITRATE (PF) 100 MCG/2ML IJ SOLN
INTRAMUSCULAR | Status: AC
  Filled 2019-06-10: qty 2

## 2019-06-10 MED ORDER — CEFAZOLIN SODIUM-DEXTROSE 1-4 GM/50ML-% IV SOLN
1.0000 g | Freq: Four times a day (QID) | INTRAVENOUS | Status: AC
  Administered 2019-06-10 – 2019-06-11 (×3): 1 g via INTRAVENOUS
  Filled 2019-06-10 (×4): qty 50

## 2019-06-10 MED ORDER — CEFAZOLIN SODIUM-DEXTROSE 2-4 GM/100ML-% IV SOLN
INTRAVENOUS | Status: AC
  Filled 2019-06-10: qty 100

## 2019-06-10 MED ORDER — PROPOFOL 10 MG/ML IV BOLUS
INTRAVENOUS | Status: DC | PRN
  Administered 2019-06-10: 50 mg via INTRAVENOUS

## 2019-06-10 MED ORDER — MIDAZOLAM HCL 2 MG/2ML IJ SOLN
INTRAMUSCULAR | Status: AC
  Filled 2019-06-10: qty 2

## 2019-06-10 MED ORDER — METFORMIN HCL 500 MG PO TABS
1000.0000 mg | ORAL_TABLET | Freq: Two times a day (BID) | ORAL | Status: DC
  Administered 2019-06-11: 1000 mg via ORAL
  Filled 2019-06-10: qty 2

## 2019-06-10 MED ORDER — FENTANYL CITRATE (PF) 100 MCG/2ML IJ SOLN: 25.0000 ug | INTRAMUSCULAR | Status: DC | PRN

## 2019-06-10 MED ORDER — PHENYLEPHRINE HCL (PRESSORS) 10 MG/ML IV SOLN
INTRAVENOUS | Status: DC | PRN
  Administered 2019-06-10: 100 ug via INTRAVENOUS
  Administered 2019-06-10: 50 ug via INTRAVENOUS
  Administered 2019-06-10 (×4): 100 ug via INTRAVENOUS
  Administered 2019-06-10: 50 ug via INTRAVENOUS

## 2019-06-10 MED ORDER — ONDANSETRON HCL 4 MG/2ML IJ SOLN
4.0000 mg | Freq: Four times a day (QID) | INTRAMUSCULAR | Status: DC | PRN
  Filled 2019-06-10: qty 2

## 2019-06-10 MED ORDER — CEFAZOLIN SODIUM-DEXTROSE 2-4 GM/100ML-% IV SOLN
2.0000 g | INTRAVENOUS | Status: AC
  Administered 2019-06-10: 2 g via INTRAVENOUS

## 2019-06-10 MED ORDER — INSULIN GLARGINE 100 UNIT/ML ~~LOC~~ SOLN
60.0000 [IU] | Freq: Every day | SUBCUTANEOUS | Status: DC
  Administered 2019-06-10 – 2019-06-11 (×2): 60 [IU] via SUBCUTANEOUS
  Filled 2019-06-10 (×2): qty 0.6

## 2019-06-10 MED ORDER — INSULIN ASPART 100 UNIT/ML ~~LOC~~ SOLN
SUBCUTANEOUS | Status: AC
  Administered 2019-06-10: 2 [IU] via SUBCUTANEOUS
  Filled 2019-06-10: qty 1

## 2019-06-10 MED ORDER — MIDAZOLAM HCL 2 MG/2ML IJ SOLN
INTRAMUSCULAR | Status: DC | PRN
  Administered 2019-06-10: 2 mg via INTRAVENOUS

## 2019-06-10 MED ORDER — ATORVASTATIN CALCIUM 80 MG PO TABS
80.0000 mg | ORAL_TABLET | Freq: Every day | ORAL | Status: DC
  Administered 2019-06-10: 80 mg via ORAL
  Filled 2019-06-10: qty 1

## 2019-06-10 MED ORDER — PROPOFOL 500 MG/50ML IV EMUL
INTRAVENOUS | Status: DC | PRN
  Administered 2019-06-10: 100 ug/kg/min via INTRAVENOUS

## 2019-06-10 MED ORDER — HYDROCHLOROTHIAZIDE 12.5 MG PO CAPS
12.5000 mg | ORAL_CAPSULE | Freq: Every day | ORAL | Status: DC
  Administered 2019-06-10 – 2019-06-11 (×2): 12.5 mg via ORAL
  Filled 2019-06-10 (×2): qty 1

## 2019-06-10 MED ORDER — ONDANSETRON HCL 4 MG/2ML IJ SOLN: 4.0000 mg | Freq: Once | INTRAMUSCULAR | Status: DC | PRN

## 2019-06-10 MED ORDER — INSULIN ASPART 100 UNIT/ML ~~LOC~~ SOLN: 2.0000 [IU] | Freq: Once | SUBCUTANEOUS | Status: AC

## 2019-06-10 MED ORDER — ACETAMINOPHEN 325 MG PO TABS: 325.0000 mg | ORAL_TABLET | ORAL | Status: DC | PRN

## 2019-06-10 SURGICAL SUPPLY — 40 items
BAG DECANTER FOR FLEXI CONT (MISCELLANEOUS) ×5 IMPLANT
BRUSH SCRUB EZ  4% CHG (MISCELLANEOUS)
BRUSH SCRUB EZ 4% CHG (MISCELLANEOUS) ×1 IMPLANT
CABLE SURG 12 DISP A/V CHANNEL (MISCELLANEOUS) ×3 IMPLANT
CANISTER SUCT 1200ML W/VALVE (MISCELLANEOUS) ×3 IMPLANT
CHLORAPREP W/TINT 26 (MISCELLANEOUS) ×3 IMPLANT
COVER LIGHT HANDLE STERIS (MISCELLANEOUS) ×6 IMPLANT
COVER MAYO STAND REUSABLE (DRAPES) ×3 IMPLANT
COVER WAND RF STERILE (DRAPES) ×3 IMPLANT
DRAPE C-ARM XRAY 36X54 (DRAPES) ×3 IMPLANT
DRSG TEGADERM 4X4.75 (GAUZE/BANDAGES/DRESSINGS) ×3 IMPLANT
DRSG TELFA 4X3 1S NADH ST (GAUZE/BANDAGES/DRESSINGS) ×3 IMPLANT
ELECT REM PT RETURN 9FT ADLT (ELECTROSURGICAL) ×3
ELECTRODE REM PT RTRN 9FT ADLT (ELECTROSURGICAL) ×1 IMPLANT
GAUZE 4X4 16PLY RFD (DISPOSABLE) ×2 IMPLANT
GLIDEWIRE STIFF .35X180X3 HYDR (WIRE) IMPLANT
GLOVE BIO SURGEON STRL SZ7.5 (GLOVE) ×3 IMPLANT
GLOVE BIO SURGEON STRL SZ8 (GLOVE) ×3 IMPLANT
GOWN STRL REUS W/ TWL LRG LVL3 (GOWN DISPOSABLE) ×1 IMPLANT
GOWN STRL REUS W/ TWL XL LVL3 (GOWN DISPOSABLE) ×1 IMPLANT
GOWN STRL REUS W/TWL LRG LVL3 (GOWN DISPOSABLE) ×2
GOWN STRL REUS W/TWL XL LVL3 (GOWN DISPOSABLE) ×2
IMMOBILIZER SHDR MD LX WHT (SOFTGOODS) IMPLANT
IMMOBILIZER SHDR XL LX WHT (SOFTGOODS) ×2 IMPLANT
INTRO PACEMAKR LEAD 9FR 13CM (INTRODUCER) ×3
INTRO PACEMKR SHEATH II 7FR (MISCELLANEOUS) ×3
INTRODUCER PACEMKR LD 9FR 13CM (INTRODUCER) ×1 IMPLANT
INTRODUCER PACEMKR SHTH II 7FR (MISCELLANEOUS) ×1 IMPLANT
IPG PACE AZUR XT DR MRI W1DR01 (Pacemaker) IMPLANT
IV NS 500ML (IV SOLUTION) ×2
IV NS 500ML BAXH (IV SOLUTION) ×1 IMPLANT
KIT TURNOVER KIT A (KITS) ×3 IMPLANT
LABEL OR SOLS (LABEL) ×3 IMPLANT
LEAD CAPSURE NOVUS 5076-52CM (Lead) ×2 IMPLANT
LEAD CAPSURE NOVUS 5076-58CM (Lead) ×2 IMPLANT
MARKER SKIN DUAL TIP RULER LAB (MISCELLANEOUS) ×3 IMPLANT
PACE AZURE XT DR MRI W1DR01 (Pacemaker) ×3 IMPLANT
PACK PACE INSERTION (MISCELLANEOUS) ×3 IMPLANT
PAD ONESTEP ZOLL R SERIES ADT (MISCELLANEOUS) ×3 IMPLANT
SUT SILK 0 SH 30 (SUTURE) ×9 IMPLANT

## 2019-06-10 NOTE — Transfer of Care (Signed)
Immediate Anesthesia Transfer of Care Note  Patient: Douglas Greene  Procedure(s) Performed: INSERTION DUAL PACEMAKER (Left Chest)  Patient Location: PACU  Anesthesia Type:General  Level of Consciousness: drowsy and patient cooperative  Airway & Oxygen Therapy: Patient Spontanous Breathing  Post-op Assessment: Report given to RN and Post -op Vital signs reviewed and stable  Post vital signs: Reviewed and stable  Last Vitals:  Vitals Value Taken Time  BP 123/80 06/10/19 1327  Temp 36.4 C 06/10/19 1327  Pulse 86 06/10/19 1330  Resp 6 06/10/19 1330  SpO2 92 % 06/10/19 1330  Vitals shown include unvalidated device data.  Last Pain:  Vitals:   06/10/19 1327  TempSrc:   PainSc: 0-No pain         Complications: No apparent anesthesia complications

## 2019-06-10 NOTE — Anesthesia Preprocedure Evaluation (Signed)
Anesthesia Evaluation  Patient identified by MRN, date of birth, ID band Patient awake    Reviewed: Allergy & Precautions, NPO status , Patient's Chart, lab work & pertinent test results, reviewed documented beta blocker date and time   Airway Mallampati: II  TM Distance: >3 FB     Dental  (+) Teeth Intact   Pulmonary sleep apnea ,    Pulmonary exam normal        Cardiovascular hypertension, Pt. on medications Normal cardiovascular exam+ dysrhythmias      Neuro/Psych negative neurological ROS  negative psych ROS   GI/Hepatic Neg liver ROS, GERD  ,  Endo/Other  diabetes  Renal/GU negative Renal ROS  negative genitourinary   Musculoskeletal negative musculoskeletal ROS (+)   Abdominal Normal abdominal exam  (+)   Peds negative pediatric ROS (+)  Hematology negative hematology ROS (+)   Anesthesia Other Findings   Reproductive/Obstetrics                             Anesthesia Physical Anesthesia Plan  ASA: III  Anesthesia Plan: MAC   Post-op Pain Management:    Induction: Intravenous  PONV Risk Score and Plan:   Airway Management Planned: Nasal Cannula  Additional Equipment:   Intra-op Plan:   Post-operative Plan:   Informed Consent: I have reviewed the patients History and Physical, chart, labs and discussed the procedure including the risks, benefits and alternatives for the proposed anesthesia with the patient or authorized representative who has indicated his/her understanding and acceptance.     Dental advisory given  Plan Discussed with: CRNA and Surgeon  Anesthesia Plan Comments:         Anesthesia Quick Evaluation

## 2019-06-10 NOTE — Progress Notes (Signed)
Patient was scheduled for cardiac cath today, however, 72 Holter revealed 14 sec pause. Cardiac cath postponed. Patient requires urgent dual chamber pacemaker today. Patient COVID negative. H and P will be copied in chart.

## 2019-06-10 NOTE — Op Note (Signed)
[facility]   06/10/2019                     1:30 PM  PATIENT:  Douglas Greene    PRE-OPERATIVE DIAGNOSIS:  sick Sinus Syndrome  POST-OPERATIVE DIAGNOSIS:  Same  PROCEDURE:  INSERTION DUAL PACEMAKER  SURGEON:  Marcina Millard, MD    ANESTHESIA:     PREOPERATIVE INDICATIONS:  [name] is a  54 y.o. male with a diagnosis of sick Sinus Syndrome who failed conservative measures and elected for surgical management.    The risks benefits and alternatives were discussed with the patient preoperatively including but not limited to the risks of infection, bleeding, cardiopulmonary complications, the need for revision surgery, among others, and the patient was willing to proceed.   OPERATIVE PROCEDURE: The patient was brought to the operating room in a fasting state.  The left pectoral region was prepped and draped in the usual sterile manner.  Anesthesia was obtained 1% lidocaine locally.  A 6 cm incision was performed over the left pectoral region.  The pacemaker pocket was generated by electrocautery and blunt dissection.  Access was obtained to the left subclavian vein.  MRI compatible leads were positioned into the right ventricular apical septum (Medtronic 5076-58 cm [id number] )  and right atrial appendage ( Medtronic 5076 52 cm [id number] ) under fluoroscopic guidance.  After proper thresholds were obtained the leads were sutured in place with 0 silk.  The leads were connected to a MRI compatible dual-chamber rate responsive pacemaker generator ( Medtronic [id number] ) .  The pacemaker pocket was irrigated with gentamicin solution.  The pacemaker generator was positioned into the pocket and the pocket was closed with 2-0 and 4-0 Vicryl, respectively.  Steri-Strips and a pressure dressing were applied.  Postprocedural interrogation revealed appropriate dual-chamber atrial and ventricular sensing and pacing thresholds.  There were no periprocedural complications.

## 2019-06-11 ENCOUNTER — Other Ambulatory Visit: Payer: Self-pay | Admitting: Cardiology

## 2019-06-11 DIAGNOSIS — I442 Atrioventricular block, complete: Secondary | ICD-10-CM | POA: Diagnosis not present

## 2019-06-11 LAB — GLUCOSE, CAPILLARY: Glucose-Capillary: 169 mg/dL — ABNORMAL HIGH (ref 70–99)

## 2019-06-11 MED ORDER — CEPHALEXIN 250 MG PO CAPS: 250.0000 mg | ORAL_CAPSULE | Freq: Four times a day (QID) | ORAL | 0 refills | Status: DC

## 2019-06-11 MED ORDER — CALCIUM CARBONATE ANTACID 500 MG PO CHEW: 1.0000 | CHEWABLE_TABLET | ORAL | 3 refills | Status: AC | PRN

## 2019-06-11 MED ORDER — CEPHALEXIN 250 MG PO CAPS: 250.0000 mg | ORAL_CAPSULE | Freq: Four times a day (QID) | ORAL | 0 refills | Status: AC

## 2019-06-11 MED ORDER — CALCIUM CARBONATE ANTACID 500 MG PO CHEW: 1.0000 | CHEWABLE_TABLET | ORAL | Status: DC | PRN

## 2019-06-11 NOTE — Anesthesia Postprocedure Evaluation (Signed)
Anesthesia Post Note  Patient: Douglas Greene  Procedure(s) Performed: INSERTION DUAL PACEMAKER (Left Chest)  Patient location during evaluation: PACU Anesthesia Type: MAC Level of consciousness: awake and alert and oriented Pain management: pain level controlled Vital Signs Assessment: post-procedure vital signs reviewed and stable Respiratory status: spontaneous breathing Cardiovascular status: blood pressure returned to baseline Anesthetic complications: no     Last Vitals:  Vitals:   06/11/19 0400 06/11/19 0805  BP: 135/81 (!) 146/87  Pulse: 73 69  Resp: 16 18  Temp: 36.7 C 36.7 C  SpO2: 95% 98%    Last Pain:  Vitals:   06/11/19 0805  TempSrc: Oral  PainSc:                  Fraidy Mccarrick

## 2019-06-11 NOTE — Discharge Summary (Signed)
Physician Discharge Summary      Patient ID: Douglas Greene MRN: [id number] DOB/AGE: 1966/03/12 54 y.o.  Admit date: 06/10/2019 Discharge date: 06/11/2019  Primary Discharge Diagnosis High grade heart block   Hospital Course: Mr. Granderson is a 54 year old male with a past medical history significant for type 2 diabetes, hypertension, and hypertriglyceridemia who presented to [facility] on 06/10/19 for dual chamber pacemaker implantation due to high degree heart block detected on long term Holter monitoring.  He was originally scheduled for an elective outpatient catheterization due to an abnormal cardiac CT, however, Holter monitor revealed a 14 second pause.  He underwent the procedure with Dr. Darrold Junker with no evidence of postprocedure complications.    Today, Mr Bilotti reports that he is doing well with minor soreness over the incision site, but no evidence of bleeding or drainage.  He denies chest pain, palpitations, shortness of breath, or lower extremity swelling.  He also denies recurrent dizziness or near syncopal episodes.     Discharge Exam: Blood pressure (!) 146/87, pulse 69, temperature 98 F (36.7 C), temperature source Oral, resp. rate 18, height 5\' 11"  (1.803 m), weight 109.3 kg, SpO2 98 %.   General appearance: alert, cooperative and appears stated age Head: Normocephalic, without obvious abnormality, atraumatic Resp: clear to auscultation bilaterally Chest wall: no tenderness, left sided chest wall tenderness Cardio: regular rate and rhythm, S1, S2 normal, no murmur, click, rub or gallop Extremities: extremities normal, atraumatic, no cyanosis or edema Pulses: 2+ and symmetric Skin: Skin color, texture, turgor normal. No rashes or lesions or incision site clean, intact, with no evidence of erythema, edema, or ecchymosis Labs:   Lab Results  Component Value Date   WBC 8.5 02/26/2015   HGB 13.8 02/26/2015   HCT 39.9 02/26/2015   MCV 86.2 02/26/2015   PLT 235  02/26/2015   No results for input(s): NA, K, CL, CO2, BUN, CREATININE, CALCIUM, PROT, BILITOT, ALKPHOS, ALT, AST, GLUCOSE in the last 168 hours.  Invalid input(s): LABALBU    Radiology: Chest xray: Pacemaker leads present in right atrium and right ventricle; no evidence of pneumothorax or acute disease  EKG: Ventricular paced rhythm   FOLLOW UP PLANS AND APPOINTMENTS  Allergies as of 06/11/2019   No Known Allergies     Medication List    TAKE these medications   acetaminophen 650 MG CR tablet Commonly known as: TYLENOL Take 650 mg by mouth daily as needed for pain.   aluminum hydroxide-magnesium carbonate 95-358 MG/15ML Susp Commonly known as: GAVISCON Take 15 mLs by mouth daily as needed for indigestion.   aspirin EC 81 MG tablet Take 81 mg by mouth daily.   atorvastatin 80 MG tablet Commonly known as: LIPITOR Take 80 mg by mouth daily.   calcium carbonate 500 MG chewable tablet Commonly known as: TUMS - dosed in mg elemental calcium Chew 4 tablets by mouth daily as needed for indigestion or heartburn.   cephALEXin 250 MG capsule Commonly known as: KEFLEX Take 1 capsule (250 mg total) by mouth 4 (four) times daily for 7 days.   insulin glargine 100 UNIT/ML injection Commonly known as: LANTUS Inject 60 Units into the skin daily.   Jardiance 25 MG Tabs tablet Generic drug: empagliflozin Take 25 mg by mouth daily.   lansoprazole 30 MG capsule Commonly known as: PREVACID Take 30 mg by mouth daily.   liraglutide 18 MG/3ML Sopn Commonly known as: VICTOZA Inject 1.2 mg into the skin daily.   metFORMIN 1000  MG tablet Commonly known as: GLUCOPHAGE Take 1,000 mg by mouth 2 (two) times daily with a meal.   multivitamin with minerals Tabs tablet Take 1 tablet by mouth daily.   valsartan-hydrochlorothiazide 80-12.5 MG tablet Commonly known as: DIOVAN-HCT Take 1 tablet by mouth daily.   vitamin C 1000 MG tablet Take 1,000 mg by mouth daily.      Follow-up  Information    Teodoro Spray, MD Follow up in 7 day(s).   Specialty: Cardiology Why: Follow up with Dr. Ubaldo Glassing or Doristine Mango, PA-C within 7-10 days of discharge Contact information: [place] [address] [place]  Time spent with patient to include physician time: 30 Signed:  The history, physical exam findings, and plan of care were all discussed with Dr. Bartholome Bill, and all decision making was made in collaboration.   Avie Arenas PA-C 06/11/2019, 8:17 AM

## 2019-06-11 NOTE — Discharge Instructions (Signed)
Avoid lifting anything greater than 15 pounds for 6-8 weeks.  Avoid lifting your left arm over your head for 6-8 weeks.  You may shower tomorrow, but avoid submersion of the incision site.  Let the steri-strips fall of on their own.   You will receive a Medtronic [employer] box in the mail. When you receive, turn it on, and plug it in within 10 feet of where you will be sleeping.

## 2019-06-11 NOTE — Plan of Care (Signed)
  Problem: Pain Managment: Goal: General experience of comfort will improve Outcome: Progressing  Patient resting peacefully.  

## 2019-06-11 NOTE — Progress Notes (Signed)
Pt given discharge information. All questions answered.

## 2019-07-14 ENCOUNTER — Other Ambulatory Visit: Payer: Self-pay | Admitting: Surgery

## 2019-07-14 DIAGNOSIS — L02213 Cutaneous abscess of chest wall: Secondary | ICD-10-CM

## 2019-07-16 MED ORDER — PANTOPRAZOLE SODIUM 40 MG PO TBEC
40.00 | DELAYED_RELEASE_TABLET | ORAL | Status: DC
Start: 2019-07-17 — End: 2019-07-16

## 2019-07-16 MED ORDER — MELATONIN 3 MG PO TABS
9.00 | ORAL_TABLET | ORAL | Status: DC
Start: 2019-07-16 — End: 2019-07-16

## 2019-07-16 MED ORDER — HYDROCHLOROTHIAZIDE 25 MG PO TABS
12.50 | ORAL_TABLET | ORAL | Status: DC
Start: 2019-07-17 — End: 2019-07-16

## 2019-07-16 MED ORDER — DEXTROSE 50 % IV SOLN
12.50 | INTRAVENOUS | Status: DC
Start: ? — End: 2019-07-16

## 2019-07-16 MED ORDER — LIDOCAINE HCL 1 % IJ SOLN
0.50 | INTRAMUSCULAR | Status: DC
Start: ? — End: 2019-07-16

## 2019-07-16 MED ORDER — GLUCAGON (RDNA) 1 MG IJ KIT
1.00 | PACK | INTRAMUSCULAR | Status: DC
Start: ? — End: 2019-07-16

## 2019-07-16 MED ORDER — GENERIC EXTERNAL MEDICATION
Status: DC
Start: ? — End: 2019-07-16

## 2019-07-16 MED ORDER — ATORVASTATIN CALCIUM 80 MG PO TABS
80.00 | ORAL_TABLET | ORAL | Status: DC
Start: 2019-07-17 — End: 2019-07-16

## 2019-07-16 MED ORDER — ASPIRIN 81 MG PO TBEC
81.00 | DELAYED_RELEASE_TABLET | ORAL | Status: DC
Start: 2019-07-17 — End: 2019-07-16

## 2019-07-16 MED ORDER — GENERIC EXTERNAL MEDICATION
1.00 | Status: DC
Start: 2019-07-16 — End: 2019-07-16

## 2019-07-16 MED ORDER — VALSARTAN 80 MG PO TABS
80.00 | ORAL_TABLET | ORAL | Status: DC
Start: 2019-07-17 — End: 2019-07-16

## 2019-07-16 MED ORDER — INSULIN LISPRO 100 UNIT/ML ~~LOC~~ SOLN
0.00 | SUBCUTANEOUS | Status: DC
Start: 2019-07-16 — End: 2019-07-16

## 2019-07-16 MED ORDER — INSULIN GLARGINE 100 UNIT/ML ~~LOC~~ SOLN
40.00 | SUBCUTANEOUS | Status: DC
Start: 2019-07-17 — End: 2019-07-16

## 2019-07-17 ENCOUNTER — Ambulatory Visit: Payer: BC Managed Care – PPO

## 2019-08-12 ENCOUNTER — Ambulatory Visit
Admission: EM | Admit: 2019-08-12 | Discharge: 2019-08-12 | Disposition: A | Discharge status: Home / Self Care | Payer: BC Managed Care – PPO | Attending: Urgent Care | Admitting: Urgent Care

## 2019-08-12 ENCOUNTER — Other Ambulatory Visit: Payer: Self-pay

## 2019-08-12 DIAGNOSIS — I808 Phlebitis and thrombophlebitis of other sites: Secondary | ICD-10-CM | POA: Diagnosis not present

## 2019-08-12 DIAGNOSIS — T82898A Other specified complication of vascular prosthetic devices, implants and grafts, initial encounter: Secondary | ICD-10-CM

## 2019-08-12 MED ORDER — NAPROXEN 500 MG PO TABS: 500.0000 mg | ORAL_TABLET | Freq: Two times a day (BID) | ORAL | 0 refills | Status: AC

## 2019-08-12 MED ORDER — CEPHALEXIN 500 MG PO CAPS: 500.0000 mg | ORAL_CAPSULE | Freq: Two times a day (BID) | ORAL | 0 refills | Status: AC

## 2019-08-12 NOTE — ED Triage Notes (Signed)
Patient states that he had an IV while admitted at [facility] on right arm. Patient states that he was given Vancomycin and they pulled IV and placed in to another area. Patient states that vein has been swelling and irritated since pulling IV on 04/13. States that he is now noticing swelling radiating in to hand and and is sore and swollen.

## 2019-08-12 NOTE — Discharge Instructions (Signed)
It was very nice seeing you today in clinic. Thank you for entrusting me with your care.   Take medications as prescribed. Apply warm compresses to arm 3-4 times a day for 15-20 minutes at a time.   Make arrangements to follow up with your regular doctor in 1 week for re-evaluation if not improving. If your symptoms/condition worsens, please seek follow up care either here or in the ER. Please remember, our [facility] providers are "right here with you" when you need [place].   Again, it was my pleasure to take care of you today. Thank you for choosing our clinic. I hope that you start to feel better quickly.   Quentin Mulling, MSN, APRN, FNP-C, CEN Advanced Practice Provider [facility]

## 2019-08-13 NOTE — ED Provider Notes (Signed)
[place],    Name: Douglas Greene DOB: 1966/01/27 MRN: [id number] CSN: [id number] PCP: Leim Fabry, MD  Arrival date and time:  08/12/19 1832  Chief Complaint:  Arm Pain  NOTE: Prior to seeing the patient today, I have reviewed the triage nursing documentation and vital signs. Clinical staff has updated patient's PMH/PSHx, current medication list, and drug allergies/intolerances to ensure comprehensive history available to assist in medical decision making.   History:   HPI: Douglas Greene is a 54 y.o. male who presents today with complaints of pain, swelling, and erythema to his RIGHT distal forearm extending down into his hand. Patient reports that he was hospitalized to have a pacemaker placement. 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 Patient was in the hospital for over a week during which time he received IV Vancomycin. Patient reports that during his "last bag", there was a problem with this PIV that causes extravasation of the antibiotic into his arm. Patient was sent home on Zyvox to complete his treatment. Patient reporting symptoms since 07/22/2019. Area of concern is firm and tender to touch. Patient unable to identify anything that improves or makes his symptoms worse.   Past Medical History:  Diagnosis Date  . Diabetes mellitus without complication (HCC)   . GERD (gastroesophageal reflux disease)   . Heart murmur    infant  . Hypertension   . Sleep apnea    no cpap now lost 50 lbs    Past Surgical History:  Procedure Laterality Date  . BACK SURGERY  14  . HARDWARE REMOVAL N/A 03/11/2015   Procedure: Removal of percutaneous hardware  - T12 - L3;  Surgeon: Julio Sicks, MD;  Location: [facility];  Service: Neurosurgery;  Laterality: N/A;  Removal of percutaneous hardware  - T12 - L3  . PACEMAKER INSERTION Left 06/10/2019   Procedure: INSERTION DUAL PACEMAKER;  Surgeon: Marcina Millard, MD;  Location: [facility] ORS;  Service:  Cardiovascular;  Laterality: Left;  . TONSILLECTOMY     54 yrs old    Family History  Adopted: Yes    Social History   Tobacco Use  . Smoking status: Never Smoker  . Smokeless tobacco: Former Neurosurgeon    Types: Chew  Substance Use Topics  . Alcohol use: Yes    Comment: occ  . Drug use: No    Patient Active Problem List   Diagnosis Date Noted  . CHB (complete heart block) (HCC) 06/10/2019  . Painful orthopaedic hardware (HCC) 03/11/2015    Home Medications:    Current Meds  Medication Sig  . acetaminophen (TYLENOL) 650 MG CR tablet Take 650 mg by mouth daily as needed for pain.  Marland Kitchen aluminum hydroxide-magnesium carbonate (GAVISCON) 95-358 MG/15ML SUSP Take 15 mLs by mouth daily as needed for indigestion.  . Ascorbic Acid (VITAMIN C) 1000 MG tablet Take 1,000 mg by mouth daily.  Marland Kitchen aspirin EC 81 MG tablet Take 81 mg by mouth daily.  Marland Kitchen atorvastatin (LIPITOR) 80 MG tablet Take 80 mg by mouth daily.  . calcium carbonate (TUMS - DOSED IN MG ELEMENTAL CALCIUM) 500 MG chewable tablet Chew 4 tablets by mouth daily as needed for indigestion or heartburn.  . calcium carbonate (TUMS - DOSED IN MG ELEMENTAL CALCIUM) 500 MG chewable tablet Chew 1 tablet (200 mg of elemental calcium total) by mouth as needed for indigestion or heartburn.  . empagliflozin (JARDIANCE) 25 MG TABS tablet Take 25 mg by mouth daily.  . insulin glargine (LANTUS) 100 UNIT/ML  injection Inject 60 Units into the skin daily.  . lansoprazole (PREVACID) 30 MG capsule Take 30 mg by mouth daily.   Marland Kitchen liraglutide (VICTOZA) 18 MG/3ML SOPN Inject 1.2 mg into the skin daily.  . metFORMIN (GLUCOPHAGE) 1000 MG tablet Take 1,000 mg by mouth 2 (two) times daily with a meal.  . Multiple Vitamin (MULTIVITAMIN WITH MINERALS) TABS tablet Take 1 tablet by mouth daily.  . valsartan-hydrochlorothiazide (DIOVAN-HCT) 80-12.5 MG tablet Take 1 tablet by mouth daily.    Allergies:   Patient has no known allergies.  Review of Systems  (ROS):  Review of systems NEGATIVE unless otherwise noted in narrative H&P section.   Vital Signs: Today's Vitals   08/12/19 1843 08/12/19 1844 08/12/19 1846 08/12/19 1946  BP:   (!) 136/92   Pulse:   88   Resp:   18   Temp:   98.4 F (36.9 C)   TempSrc:   Oral   SpO2:   99%   Weight:  235 lb (106.6 kg)    Height:  6' (1.829 m)    PainSc: 2    2     Physical Exam: Physical Exam  Constitutional: He is oriented to person, place, and time and well-developed, well-nourished, and in no distress.  HENT:  Head: Normocephalic and atraumatic.  Eyes: Pupils are equal, round, and reactive to light.  Cardiovascular: Normal rate and intact distal pulses.  Pulmonary/Chest: Effort normal. No respiratory distress.  Musculoskeletal:     Right forearm: Swelling and tenderness present.     Right hand: Swelling and tenderness present. Normal range of motion. Normal strength. Normal sensation. Normal capillary refill.     Comments: (+) erythema and tenderness  Neurological: He is alert and oriented to person, place, and time. He has normal sensation, normal strength and normal reflexes. Gait normal.  Skin: Skin is warm and dry. No rash noted. He is not diaphoretic.  Psychiatric: Mood, memory, affect and judgment normal.  Nursing note and vitals reviewed.        Urgent Care Treatments / Results:   No orders of the defined types were placed in this encounter.   LABS: PLEASE NOTE: all labs that were ordered this encounter are listed, however only abnormal results are displayed. Labs Reviewed - No data to display  EKG: -None  RADIOLOGY: No results found.  PROCEDURES: Procedures  MEDICATIONS RECEIVED THIS VISIT: Medications - No data to display  PERTINENT CLINICAL COURSE NOTES/UPDATES:   Initial Impression / Assessment and Plan / Urgent Care Course:  Pertinent labs & imaging results that were available during my care of the patient were personally reviewed by me and considered  in my medical decision making (see lab/imaging section of note for values and interpretations).  Douglas Greene is a 54 y.o. male who presents to [facility] today with complaints of Arm Pain  Patient is well appearing overall in clinic today. He does not appear to be in any acute distress. Presenting symptoms (see HPI) and exam as documented above. Symptoms and presentation consistent with superficial thrombophlebitis. Discussed with attending on duty Terrilee Croak, MD); MD to room to visualize hand. MD agrees with assessment. Treating with a 7 day course of oral cephalexin and naproxen. Recommended warm compresses several times a day. Patient to return call to clinic if not improving.   Discussed follow up with primary care physician in 1 week for re-evaluation. I have reviewed the follow up and strict return precautions for any new or worsening symptoms.  Patient is aware of symptoms that would be deemed urgent/emergent, and would thus require further evaluation either here or in the emergency department. At the time of discharge, he verbalized understanding and consent with the discharge plan as it was reviewed with him. All questions were fielded by provider and/or clinic staff prior to patient discharge.    Final Clinical Impressions / Urgent Care Diagnoses:   Final diagnoses:  Thrombophlebitis arm  Extravasation injury of IV catheter site with other complication, initial encounter [facility])    New Prescriptions:  [place] Controlled Substance Registry consulted? Not Applicable  Meds ordered this encounter  Medications  . cephALEXin (KEFLEX) 500 MG capsule    Sig: Take 1 capsule (500 mg total) by mouth 2 (two) times daily for 7 days.    Dispense:  14 capsule    Refill:  0  . naproxen (NAPROSYN) 500 MG tablet    Sig: Take 1 tablet (500 mg total) by mouth 2 (two) times daily.    Dispense:  30 tablet    Refill:  0    Recommended Follow up Care:  Patient encouraged to follow up with the  following provider within the specified time frame, or sooner as dictated by the severity of his symptoms. As always, he was instructed that for any urgent/emergent care needs, he should seek care either here or in the emergency department for more immediate evaluation.  Follow-up Information    Gayland Curry, MD In 1 week.   Specialty: Family Medicine Why: General reassessment of symptoms if not improving Contact information: [place] [address] [phone]         NOTE: This note was prepared using Dragon dictation software along with smaller phrase technology. Despite my best ability to proofread, there is the potential that transcriptional errors may still occur from this process, and are completely unintentional.    Karen Kitchens, NP 08/13/19 1657

## 2019-11-02 ENCOUNTER — Emergency Department
Admission: EM | Admit: 2019-11-02 | Discharge: 2019-11-02 | Disposition: A | Discharge status: Home / Self Care | Payer: BC Managed Care – PPO | Attending: Emergency Medicine | Admitting: Emergency Medicine

## 2019-11-02 ENCOUNTER — Other Ambulatory Visit: Payer: Self-pay

## 2019-11-02 ENCOUNTER — Emergency Department: Payer: BC Managed Care – PPO

## 2019-11-02 DIAGNOSIS — I10 Essential (primary) hypertension: Secondary | ICD-10-CM | POA: Insufficient documentation

## 2019-11-02 DIAGNOSIS — M545 Low back pain: Secondary | ICD-10-CM | POA: Diagnosis not present

## 2019-11-02 DIAGNOSIS — Z950 Presence of cardiac pacemaker: Secondary | ICD-10-CM | POA: Diagnosis not present

## 2019-11-02 DIAGNOSIS — M546 Pain in thoracic spine: Secondary | ICD-10-CM | POA: Diagnosis not present

## 2019-11-02 DIAGNOSIS — Z7982 Long term (current) use of aspirin: Secondary | ICD-10-CM | POA: Insufficient documentation

## 2019-11-02 DIAGNOSIS — Y929 Unspecified place or not applicable: Secondary | ICD-10-CM | POA: Diagnosis not present

## 2019-11-02 DIAGNOSIS — Z87891 Personal history of nicotine dependence: Secondary | ICD-10-CM | POA: Diagnosis not present

## 2019-11-02 DIAGNOSIS — Z79899 Other long term (current) drug therapy: Secondary | ICD-10-CM | POA: Insufficient documentation

## 2019-11-02 DIAGNOSIS — E119 Type 2 diabetes mellitus without complications: Secondary | ICD-10-CM | POA: Diagnosis not present

## 2019-11-02 DIAGNOSIS — Y999 Unspecified external cause status: Secondary | ICD-10-CM | POA: Diagnosis not present

## 2019-11-02 DIAGNOSIS — Y939 Activity, unspecified: Secondary | ICD-10-CM | POA: Diagnosis not present

## 2019-11-02 DIAGNOSIS — W19XXXA Unspecified fall, initial encounter: Secondary | ICD-10-CM

## 2019-11-02 DIAGNOSIS — W108XXA Fall (on) (from) other stairs and steps, initial encounter: Secondary | ICD-10-CM | POA: Diagnosis not present

## 2019-11-02 DIAGNOSIS — Z794 Long term (current) use of insulin: Secondary | ICD-10-CM | POA: Insufficient documentation

## 2019-11-02 MED ORDER — ONDANSETRON 4 MG PO TBDP: 4.0000 mg | ORAL_TABLET | Freq: Three times a day (TID) | ORAL | 0 refills | Status: AC | PRN

## 2019-11-02 MED ORDER — ONDANSETRON 4 MG PO TBDP
4.0000 mg | ORAL_TABLET | Freq: Once | ORAL | Status: AC
  Administered 2019-11-02: 4 mg via ORAL
  Filled 2019-11-02: qty 1

## 2019-11-02 MED ORDER — TRAMADOL HCL 50 MG PO TABS: 50.0000 mg | ORAL_TABLET | Freq: Four times a day (QID) | ORAL | 0 refills | Status: AC | PRN

## 2019-11-02 MED ORDER — OXYCODONE-ACETAMINOPHEN 5-325 MG PO TABS
1.0000 | ORAL_TABLET | Freq: Once | ORAL | Status: AC
  Administered 2019-11-02: 1 via ORAL
  Filled 2019-11-02: qty 1

## 2019-11-02 NOTE — Discharge Instructions (Signed)
You can take Tramadol for pain.  

## 2019-11-02 NOTE — ED Triage Notes (Signed)
Patient reports mechanical fall down 3 stairs.  Patient c/o back pain post fall. Patient denies dizziness/lightheadedness, LOC and head injury.

## 2019-11-02 NOTE — ED Provider Notes (Signed)
Emergency Department Provider Note  ____________________________________________  Time seen: Approximately 8:28 PM  I have reviewed the triage vital signs and the nursing notes.   HISTORY  Chief Complaint Fall   Historian Patient   HPI Douglas Greene is a 54 y.o. male with history of thoracic and lumbar spine fractures sustained many years ago, presents to the emergency department with new low back pain after patient had a mechanical fall and fell down 3 steps.  He denies hitting his head or his neck.  No numbness or tingling in the upper and lower extremities.  No chest pain, chest tightness or abdominal pain.  He has been able to ambulate since fall occurred.  No alleviating measures have been attempted.   Past Medical History:  Diagnosis Date  . Diabetes mellitus without complication (HCC)   . GERD (gastroesophageal reflux disease)   . Heart murmur    infant  . Hypertension   . Sleep apnea    no cpap now lost 50 lbs     Immunizations up to date:  Yes.     Past Medical History:  Diagnosis Date  . Diabetes mellitus without complication (HCC)   . GERD (gastroesophageal reflux disease)   . Heart murmur    infant  . Hypertension   . Sleep apnea    no cpap now lost 50 lbs    Patient Active Problem List   Diagnosis Date Noted  . CHB (complete heart block) (HCC) 06/10/2019  . Painful orthopaedic hardware (HCC) 03/11/2015    Past Surgical History:  Procedure Laterality Date  . BACK SURGERY  14  . HARDWARE REMOVAL N/A 03/11/2015   Procedure: Removal of percutaneous hardware  - T12 - L3;  Surgeon: Julio Sicks, MD;  Location: [facility];  Service: Neurosurgery;  Laterality: N/A;  Removal of percutaneous hardware  - T12 - L3  . PACEMAKER INSERTION Left 06/10/2019   Procedure: INSERTION DUAL PACEMAKER;  Surgeon: Marcina Millard, MD;  Location: [facility];  Service: Cardiovascular;  Laterality: Left;  . TONSILLECTOMY     54 yrs old    Prior to Admission  medications   Medication Sig Start Date End Date Taking? Authorizing Provider  acetaminophen (TYLENOL) 650 MG CR tablet Take 650 mg by mouth daily as needed for pain.    [provider]  aluminum hydroxide-magnesium carbonate (GAVISCON) 95-358 MG/15ML SUSP Take 15 mLs by mouth daily as needed for indigestion.    [provider]  Ascorbic Acid (VITAMIN C) 1000 MG tablet Take 1,000 mg by mouth daily.    [provider]  aspirin EC 81 MG tablet Take 81 mg by mouth daily.    [provider]  atorvastatin (LIPITOR) 80 MG tablet Take 80 mg by mouth daily.    [provider]  calcium carbonate (TUMS - DOSED IN MG ELEMENTAL CALCIUM) 500 MG chewable tablet Chew 4 tablets by mouth daily as needed for indigestion or heartburn.    [provider]  calcium carbonate (TUMS - DOSED IN MG ELEMENTAL CALCIUM) 500 MG chewable tablet Chew 1 tablet (200 mg of elemental calcium total) by mouth as needed for indigestion or heartburn. 06/11/19   Andi Hence, PA-C  empagliflozin (JARDIANCE) 25 MG TABS tablet Take 25 mg by mouth daily.    [provider]  insulin glargine (LANTUS) 100 UNIT/ML injection Inject 60 Units into the skin daily.    [provider]  lansoprazole (PREVACID) 30 MG capsule Take 30 mg by mouth daily.  [provider]  liraglutide (VICTOZA) 18 MG/3ML SOPN Inject 1.2 mg into the skin daily.    [provider]  metFORMIN (GLUCOPHAGE) 1000 MG tablet Take 1,000 mg by mouth 2 (two) times daily with a meal.    [provider]  Multiple Vitamin (MULTIVITAMIN WITH MINERALS) TABS tablet Take 1 tablet by mouth daily.    [provider]  naproxen (NAPROSYN) 500 MG tablet Take 1 tablet (500 mg total) by mouth 2 (two) times daily. 08/12/19   Verlee Monte, NP  ondansetron (ZOFRAN ODT) 4 MG disintegrating tablet Take 1 tablet (4 mg total) by mouth every 8 (eight) hours as needed for up to 5 days. 11/02/19  11/07/19  Orvil Feil, PA-C  traMADol (ULTRAM) 50 MG tablet Take 1 tablet (50 mg total) by mouth every 6 (six) hours as needed for up to 3 days. 11/02/19 11/05/19  Orvil Feil, PA-C  valsartan-hydrochlorothiazide (DIOVAN-HCT) 80-12.5 MG tablet Take 1 tablet by mouth daily.    [provider]    Allergies Patient has no known allergies.  Family History  Adopted: Yes    Social History Social History   Tobacco Use  . Smoking status: Never Smoker  . Smokeless tobacco: Former Neurosurgeon    Types: Engineer, drilling  . Vaping Use: Never used  Substance Use Topics  . Alcohol use: Yes    Comment: occ  . Drug use: No     Review of Systems  Constitutional: No fever/chills Eyes:  No discharge ENT: No upper respiratory complaints. Respiratory: no cough. No SOB/ use of accessory muscles to breath Gastrointestinal:   No nausea, no vomiting.  No diarrhea.  No constipation. Musculoskeletal: Patient has back pain.  Skin: Negative for rash, abrasions, lacerations, ecchymosis.   ____________________________________________   PHYSICAL EXAM:  VITAL SIGNS: ED Triage Vitals [11/02/19 1935]  Enc Vitals Group     BP (!) 134/85     Pulse Rate 85     Resp 18     Temp 99.1 F (37.3 C)     Temp src      SpO2 97 %     Weight      Height      Head Circumference      Peak Flow      Pain Score 8     Pain Loc      Pain Edu?      Excl. in GC?      Constitutional: Alert and oriented. Well appearing and in no acute distress. Eyes: Conjunctivae are normal. PERRL. EOMI. Head: Atraumatic. Cardiovascular: Normal rate, regular rhythm. Normal S1 and S2.  Good peripheral circulation. Respiratory: Normal respiratory effort without tachypnea or retractions. Lungs CTAB. Good air entry to the bases with no decreased or absent breath sounds Gastrointestinal: Bowel sounds x 4 quadrants. Soft and nontender to palpation. No guarding or rigidity. No distention. Musculoskeletal: Full range of  motion to all extremities. No obvious deformities noted Neurologic:  Normal for age. No gross focal neurologic deficits are appreciated.  Skin:  Skin is warm, dry and intact. No rash noted. Psychiatric: Mood and affect are normal for age. Speech and behavior are normal.   ____________________________________________   LABS (all labs ordered are listed, but only abnormal results are displayed)  Labs Reviewed - No data to display ____________________________________________  EKG   ____________________________________________  RADIOLOGY Geraldo Pitter, personally viewed and evaluated these images (plain radiographs) as part of my medical decision making, as  well as reviewing the written report by the radiologist.    DG Thoracic Spine 2 View  Result Date: 11/02/2019 CLINICAL DATA:  Fall, mid back pain EXAM: THORACIC SPINE 2 VIEWS COMPARISON:  None. FINDINGS: Probable T12-L1 fusion, not fully included on this examination. Normal thoracic kyphosis. No fracture or listhesis of the thoracic spine. Vertebral body height has been preserved. Intervertebral disc height has been preserved. The paraspinal soft tissues are unremarkable. Incidentally noted is a pedicle screw fragment within the expected left pedicle of L1 and. IMPRESSION: No acute fracture or listhesis. Electronically Signed   By: Helyn Numbers MD   On: 11/02/2019 21:15   DG Lumbar Spine 2-3 Views  Result Date: 11/02/2019 CLINICAL DATA:  Fall, back pain EXAM: LUMBAR SPINE - 2-3 VIEW COMPARISON:  CT 12/25/2014 FINDINGS: There has developed interval fusion of T11-12 anteriorly with resultant focal kyphosis at the at this level. Residual pedicle screw fragment identified within the left pedicle of T12. Moderate compression fracture of L1 demonstrating a mid body cleft with approximately 30% loss of height is stable since prior CT examination. Normal lumbar lordosis. No acute fracture or listhesis identified. Mild intervertebral disc  space narrowing at L1-2 in keeping with changes of mild degenerative disc disease, unchanged. Remaining intervertebral disc heights are preserved. The paraspinal soft tissues are unremarkable. IMPRESSION: No acute fracture or listhesis. Electronically Signed   By: Helyn Numbers MD   On: 11/02/2019 21:19    ____________________________________________    PROCEDURES  Procedure(s) performed:     Procedures     Medications  oxyCODONE-acetaminophen (PERCOCET/ROXICET) 5-325 MG per tablet 1 tablet (1 tablet Oral Given 11/02/19 2136)  ondansetron (ZOFRAN-ODT) disintegrating tablet 4 mg (4 mg Oral Given 11/02/19 2137)     ____________________________________________   INITIAL IMPRESSION / ASSESSMENT AND PLAN / ED COURSE  Pertinent labs & imaging results that were available during my care of the patient were reviewed by me and considered in my medical decision making (see chart for details).    Assessment and plan:  Fall, initial encounter 54 year old male presents to the emergency department after a mechanical fall down 3 steps.  Vital signs were reassuring at triage.  On physical exam, patient had no tenderness reproduced to palpation.  X-ray of the thoracic spine and lumbar spine revealed no acute abnormality.  He was given Percocet in the emergency department for pain.  He was discharged with a short course of tramadol.  He was advised to follow-up with primary care as needed.   ____________________________________________  FINAL CLINICAL IMPRESSION(S) / ED DIAGNOSES  Final diagnoses:  Fall, initial encounter      NEW MEDICATIONS STARTED DURING THIS VISIT:  ED Discharge Orders         Ordered    traMADol (ULTRAM) 50 MG tablet  Every 6 hours PRN     Discontinue  Reprint     11/02/19 2135    ondansetron (ZOFRAN ODT) 4 MG disintegrating tablet  Every 8 hours PRN     Discontinue  Reprint     11/02/19 2135              This chart was dictated using voice  recognition software/Dragon. Despite best efforts to proofread, errors can occur which can change the meaning. Any change was purely unintentional.     Gasper Lloyd 11/02/19 2157    Emily Filbert, MD 11/02/19 2220
